# Patient Record
Sex: Male | Born: 1975 | Race: White | Hispanic: No | Marital: Married | Smoking: Never smoker
Health system: Southern US, Community
[De-identification: ages and names within clinical notes are randomized; demographics above are authoritative.]

## PROBLEM LIST (undated history)

## (undated) DIAGNOSIS — H547 Unspecified visual loss: Secondary | ICD-10-CM

## (undated) DIAGNOSIS — R200 Anesthesia of skin: Secondary | ICD-10-CM

## (undated) DIAGNOSIS — A419 Sepsis, unspecified organism: Secondary | ICD-10-CM

## (undated) DIAGNOSIS — F329 Major depressive disorder, single episode, unspecified: Secondary | ICD-10-CM

## (undated) DIAGNOSIS — E119 Type 2 diabetes mellitus without complications: Secondary | ICD-10-CM

## (undated) DIAGNOSIS — G629 Polyneuropathy, unspecified: Secondary | ICD-10-CM

## (undated) DIAGNOSIS — F419 Anxiety disorder, unspecified: Secondary | ICD-10-CM

## (undated) DIAGNOSIS — R339 Retention of urine, unspecified: Secondary | ICD-10-CM

## (undated) DIAGNOSIS — B029 Zoster without complications: Secondary | ICD-10-CM

## (undated) DIAGNOSIS — F32A Depression, unspecified: Secondary | ICD-10-CM

## (undated) HISTORY — PX: COMBINED KIDNEY-PANCREAS TRANSPLANT: SHX1382

## (undated) HISTORY — DX: Anesthesia of skin: R20.0

## (undated) HISTORY — PX: WISDOM TOOTH EXTRACTION: SHX21

## (undated) HISTORY — DX: Type 2 diabetes mellitus without complications: E11.9

## (undated) HISTORY — PX: EYE SURGERY: SHX253

## (undated) HISTORY — PX: KIDNEY SURGERY: SHX687

## (undated) HISTORY — DX: Zoster without complications: B02.9

## (undated) HISTORY — DX: Polyneuropathy, unspecified: G62.9

## (undated) HISTORY — DX: Major depressive disorder, single episode, unspecified: F32.9

## (undated) HISTORY — DX: Sepsis, unspecified organism: A41.9

## (undated) HISTORY — DX: Depression, unspecified: F32.A

## (undated) HISTORY — DX: Unspecified visual loss: H54.7

## (undated) HISTORY — DX: Anxiety disorder, unspecified: F41.9

## (undated) HISTORY — DX: Retention of urine, unspecified: R33.9

---

## 2009-03-17 ENCOUNTER — Ambulatory Visit: Payer: Self-pay | Admitting: Radiology

## 2009-03-17 ENCOUNTER — Emergency Department (HOSPITAL_BASED_OUTPATIENT_CLINIC_OR_DEPARTMENT_OTHER): Admission: EM | Admit: 2009-03-17 | Discharge: 2009-03-17 | Payer: Self-pay | Admitting: Emergency Medicine

## 2009-03-18 ENCOUNTER — Inpatient Hospital Stay (HOSPITAL_COMMUNITY): Admission: EM | Admit: 2009-03-18 | Discharge: 2009-03-23 | Payer: Self-pay | Admitting: Internal Medicine

## 2009-03-18 ENCOUNTER — Encounter (HOSPITAL_COMMUNITY): Payer: Self-pay | Admitting: Emergency Medicine

## 2009-03-18 ENCOUNTER — Ambulatory Visit: Payer: Self-pay | Admitting: Diagnostic Radiology

## 2009-03-21 ENCOUNTER — Encounter (INDEPENDENT_AMBULATORY_CARE_PROVIDER_SITE_OTHER): Payer: Self-pay | Admitting: Internal Medicine

## 2009-03-21 ENCOUNTER — Ambulatory Visit: Payer: Self-pay | Admitting: Infectious Diseases

## 2009-03-23 ENCOUNTER — Encounter: Payer: Self-pay | Admitting: Infectious Diseases

## 2009-04-14 ENCOUNTER — Ambulatory Visit: Payer: Self-pay | Admitting: Infectious Diseases

## 2009-04-14 DIAGNOSIS — J1100 Influenza due to unidentified influenza virus with unspecified type of pneumonia: Secondary | ICD-10-CM

## 2009-04-14 DIAGNOSIS — J189 Pneumonia, unspecified organism: Secondary | ICD-10-CM | POA: Insufficient documentation

## 2009-08-07 ENCOUNTER — Ambulatory Visit (HOSPITAL_COMMUNITY): Admission: RE | Admit: 2009-08-07 | Discharge: 2009-08-07 | Payer: Self-pay | Admitting: Gastroenterology

## 2009-10-09 ENCOUNTER — Emergency Department (HOSPITAL_BASED_OUTPATIENT_CLINIC_OR_DEPARTMENT_OTHER): Admission: EM | Admit: 2009-10-09 | Discharge: 2009-10-10 | Payer: Self-pay | Admitting: Emergency Medicine

## 2009-10-09 ENCOUNTER — Ambulatory Visit: Payer: Self-pay | Admitting: Diagnostic Radiology

## 2010-02-23 NOTE — Miscellaneous (Signed)
Summary: HIPAA Restrictions  HIPAA Restrictions   Imported By: Florinda Marker 04/15/2009 16:23:46  _____________________________________________________________________  External Attachment:    Type:   Image     Comment:   External Document

## 2010-02-23 NOTE — Assessment & Plan Note (Signed)
Summary: hsfu need chart/kam   CC:  new patient hospital follow up pneumonia and Depression.  History of Present Illness: 35 yo with DM and recent admit for PNA.  I saw him in the hospital when he was admitted with high fevers, cough, sob and n/v.    He was admitted to [facility] on March 19, 2009, and started on antibiotics for community-acquired pneumonia as well as for influenza with  Zithromax, Rocephin, and Tamiflu.   He worsened initially but I think this was due to IVF he had gotten (BNP was 600).  He had all cxs neg inc leg ag, hiv, flu test, fungal ab, crytop, ana  and rf.  fortunately he improved and was d/ced.  Returns for f/u Has followed up with Dr Louis Meckel and has been changed from norvacs to diovan given the worsening edema. No fevers chills, NS (unless BS is low) nausea vomiting.  Cough is resolved.  appetit good. no wt loss  Consult note 35 yo with a history of DM. He also has a  history of hypertension, anxiety, depression and GERD.  He reports also  history of anemia in the past.  He started feeling ill approximately 6  days ago when he had a headache and a yucky feeling.  He said he had  some mild chest tightness and cough, but thought it was due to exposure  to smoke at his in-laws' house where he was visiting the day prior.  He  also had hiccups developed.  His nerves were bad apparently because he   was concerned about an upcoming eye surgery.  The next day, he developed  worsening headaches, nausea, and vomiting.  He went to the emergency  room on March 17, 2009, with these symptoms and was told that he had  viral illness and was given Phenergan and Zofran and discharged home.   He continued to worsen at that time, and at home that his nausea and  vomiting continued.  He started to have severe coughing, some fevers, although they did not check with a thermometer.  He had some chills and  night sweats as well.  He has also had a marked decrease in his appetite  for the last few  days and says he has not eaten much in a week.  He  denies any rash, sore throat, swollen lymph glands, diarrhea, abdominal  pain, joint swelling or aches, or myalgias.         Depression History:      The patient denies a depressed mood most of the day and a diminished interest in his usual daily activities.        The patient denies that he feels like life is not worth living, denies that he wishes that he were dead, and denies that he has thought about ending his life.        Preventive Screening-Counseling & Management  Alcohol-Tobacco     Alcohol drinks/day: 0     Smoking Status: never  Caffeine-Diet-Exercise     Caffeine use/day: soda and tea x 4 a day     Does Patient Exercise: yes     Type of exercise: walking     Exercise (avg: min/session): <30     Times/week: >7   Updated Prior Medication List: LANTUS SOLOSTAR 100 UNIT/ML SOLN (INSULIN GLARGINE) 28 units at night APIDRA 100 UNIT/ML SOLN (INSULIN GLULISINE) approximately 12 units daily PROTONIX 40 MG TBEC (PANTOPRAZOLE SODIUM) Take 1 tablet by mouth once a day [facility]  XR 75 MG XR24H-CAP (VENLAFAXINE HCL) Take 1 tablet by mouth once a day ZANTAC 150 MG TABS (RANITIDINE HCL) one tablet nightly DIOVAN HCT 320-25 MG TABS (VALSARTAN-HYDROCHLOROTHIAZIDE) Take 1 tablet by mouth once a day COLESTIPOL HCL 5 GM PACK (COLESTIPOL HCL) 5 grams daily METAMUCIL 30.9 % POWD (PSYLLIUM) once a day METANX 3-35-2 MG TABS (L-METHYLFOLATE-B6-B12) take 2 daily DIPHENOXYLATE-ATROPINE 2.5-0.025 MG TABS (DIPHENOXYLATE-ATROPINE) as needed  Current Allergies: ! SULFA Past History:  Family History: Last updated: 04/14/2009 [place]  Social History: Last updated: 04/14/2009  The patient is married.  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  He does not smoke, drink, or   use drugs.  He has no children at home.  He has two cats at home, but no   birds or other animal exposures.  He lives in an apartment.  He does not    do many outdoor cavities and has no unusual hobbies.  He has never been   outside of the country and has no recent travel.  He does have some sick   contacts at work, but none in his family.   Risk Factors: Alcohol Use: 0 (04/14/2009) Caffeine Use: soda and tea x 4 a day (04/14/2009) Exercise: yes (04/14/2009)  Risk Factors: Smoking Status: never (04/14/2009)  Past Medical History: 1. 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   2. 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   3. He has anxiety and depression.   4. GERD.   5. Allergies but no asthma.   6. Hypertension, recently diagnosed.   7. Mild chronic renal insufficiency.  Per the patient his creatinine       is at the upper limit of normal, but he does not know what it is.       He has had no further workup for this.   8. Mild anemia.  He says he had his iron level checked and was told to       eat more meat in the past.   9. Prior history of pneumonia many years ago in college.      He has had no surgeries.   Family History: [place]  Social History:  The patient is married.  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  He does not smoke, drink, or   use drugs.  He has no children at home.  He has two cats at home, but no   birds or other animal exposures.  He lives in an apartment.  He does not   do many outdoor cavities and has no unusual hobbies.  He has never been   outside of the country and has no recent travel.  He does have some sick   contacts at work, but none in his family.   Review of Systems       11 systems reviewed and negative except per HPI   Vital Signs:  Patient profile:   35 year old male Height:      70.5 inches (179.07 cm) Weight:      166.4 pounds (75.64 kg) BMI:     23.62 Temp:     97.7 degrees F (36.50 degrees C) oral Pulse rate:   96 / minute BP sitting:   152 / 101  (left arm)  Vitals Entered By: Wendall Mola CMA Duncan Dull) (April 14, 2009 11:53 AM) CC: new patient hospital follow up pneumonia, Depression Is Patient Diabetic? Yes  Did you bring your meter with you today? No Pain Assessment Patient in pain? no      Nutritional Status BMI of 19 -24 = normal Nutritional Status Detail appetite "normal"  Does patient need assistance? Functional Status Self care Ambulation Normal Comments no missed doses of meds per patient   Physical Exam  General:  alert and well-developed.  alert and well-developed.   Head:  normocephalic.  normocephalic.   Eyes:  decrease acuity Mouth:  fair dentition.  fair dentition.   Neck:  supple.  supple.   Lungs:  normal respiratory effort and normal breath sounds.  normal respiratory effort and normal breath sounds.   Heart:  normal rate and regular rhythm.  normal rate and regular rhythm.   Abdomen:  soft and non-tender.  soft and non-tender.   Msk:  normal ROM and no joint tenderness.  normal ROM and no joint tenderness.   Extremities:  1+ edema bil Neurologic:  alert & oriented X3 and cranial nerves II-XII intact.  alert & oriented X3 and cranial nerves II-XII intact.   Skin:  no rashes.  no rashes.   Cervical Nodes:  no anterior cervical adenopathy and no posterior cervical adenopathy.  no anterior cervical adenopathy and no posterior cervical adenopathy.   Psych:  Oriented X3 and memory intact for recent and remote.  Oriented X3 and memory intact for recent and remote.   Additional Exam:  Echo Mar 23 2009   Left ventricle: The cavity size was normal. There was mild     concentric hypertrophy. Systolic function was normal. The     estimated ejection fraction was in the range of 50% to 55%. Wall     motion was normal; there were no regional wall motion     abnormalities. Doppler parameters are consistent with abnormal     left ventricular relaxation (grade 1 diastolic dysfunction).   - Mitral valve: Mild regurgitation.   - Left atrium: The atrium was mildly dilated.   -  Pericardium, extracardiac: A small pericardial effusion was     identified posterior to the heart. There was no evidence of     hemodynamic compromise.   CT chest feb 25th 1. Moderate layering pleural effusions bilaterally.  Favor   transudative.   2.  Diffuse but basilar predominant  pulmonary nodular opacity.   Centrilobular and juxtapleural pattern with mild septal thickening.   Acute viral / atypical pneumonia favored.   3.  Mild left perinephric stranding, this is incompletely   visualized and is not apparent on the recent renal ultrasound.   Pyelonephritis cannot be excluded.  Correlation with urinalysis may   be helpful.   Impression & Recommendations:  Problem # 1:  INFLUENZA WITH PNEUMONIA (ICD-487.0) Influenza B + PNA He is much improved s/p treatment with tamiflu and abx.   Will follow up as needed.  Orders: Est. Patient Level III (29562)  Medications Added to Medication List This Visit: 1)  Lantus Solostar 100 Unit/ml Soln (Insulin glargine) .... 28 units at night 2)  Apidra 100 Unit/ml Soln (Insulin glulisine) .... Approximately 12 units daily 3)  Protonix 40 Mg Tbec (Pantoprazole sodium) .... Take 1 tablet by mouth once a day 4)  Effexor Xr 75 Mg Xr24h-cap (Venlafaxine hcl) .... Take 1 tablet by mouth once a day 5)  Zantac 150 Mg Tabs (Ranitidine hcl) .... One tablet nightly 6)  Diovan Hct 320-25 Mg Tabs (Valsartan-hydrochlorothiazide) .... Take 1 tablet by mouth once a day 7)  Colestipol Hcl  5 Gm Pack (Colestipol hcl) .... 5 grams daily 8)  Metamucil 30.9 % Powd (Psyllium) .... Once a day 9)  Metanx 3-35-2 Mg Tabs (L-methylfolate-b6-b12) .... Take 2 daily 10)  Diphenoxylate-atropine 2.5-0.025 Mg Tabs (Diphenoxylate-atropine) .... As needed  Patient Instructions: 1)  Follow up as needed. 2)   Call if you have new cough, shortness of breath or fevers.

## 2010-02-25 ENCOUNTER — Emergency Department (HOSPITAL_COMMUNITY)
Admission: EM | Admit: 2010-02-25 | Discharge: 2010-02-25 | Disposition: A | Discharge status: Home / Self Care | Payer: Medicare Other | Attending: Emergency Medicine | Admitting: Emergency Medicine

## 2010-02-25 ENCOUNTER — Emergency Department (HOSPITAL_COMMUNITY): Payer: Medicare Other

## 2010-02-25 DIAGNOSIS — F29 Unspecified psychosis not due to a substance or known physiological condition: Secondary | ICD-10-CM | POA: Insufficient documentation

## 2010-02-25 DIAGNOSIS — R51 Headache: Secondary | ICD-10-CM | POA: Insufficient documentation

## 2010-02-25 DIAGNOSIS — Z79899 Other long term (current) drug therapy: Secondary | ICD-10-CM | POA: Insufficient documentation

## 2010-02-25 DIAGNOSIS — Z992 Dependence on renal dialysis: Secondary | ICD-10-CM | POA: Insufficient documentation

## 2010-02-25 DIAGNOSIS — K219 Gastro-esophageal reflux disease without esophagitis: Secondary | ICD-10-CM | POA: Insufficient documentation

## 2010-02-25 DIAGNOSIS — F341 Dysthymic disorder: Secondary | ICD-10-CM | POA: Insufficient documentation

## 2010-02-25 DIAGNOSIS — R4182 Altered mental status, unspecified: Secondary | ICD-10-CM | POA: Insufficient documentation

## 2010-02-25 DIAGNOSIS — I10 Essential (primary) hypertension: Secondary | ICD-10-CM | POA: Insufficient documentation

## 2010-02-25 DIAGNOSIS — E119 Type 2 diabetes mellitus without complications: Secondary | ICD-10-CM | POA: Insufficient documentation

## 2010-02-25 DIAGNOSIS — R4789 Other speech disturbances: Secondary | ICD-10-CM | POA: Insufficient documentation

## 2010-02-25 LAB — CBC
HCT: 33 % — ABNORMAL LOW (ref 39.0–52.0)
MCHC: 31.2 g/dL (ref 30.0–36.0)
RDW: 17.6 % — ABNORMAL HIGH (ref 11.5–15.5)
WBC: 6 10*3/uL (ref 4.0–10.5)

## 2010-02-25 LAB — DIFFERENTIAL
Basophils Absolute: 0.1 10*3/uL (ref 0.0–0.1)
Basophils Relative: 2 % — ABNORMAL HIGH (ref 0–1)
Lymphs Abs: 1.4 10*3/uL (ref 0.7–4.0)
Monocytes Absolute: 0.6 10*3/uL (ref 0.1–1.0)
Neutrophils Relative %: 60 % (ref 43–77)

## 2010-02-25 LAB — BASIC METABOLIC PANEL
CO2: 26 mEq/L (ref 19–32)
Calcium: 8.2 mg/dL — ABNORMAL LOW (ref 8.4–10.5)
Creatinine, Ser: 3.53 mg/dL — ABNORMAL HIGH (ref 0.4–1.5)
GFR calc non Af Amer: 20 mL/min — ABNORMAL LOW (ref >60)
Sodium: 139 mEq/L (ref 135–145)

## 2010-02-25 LAB — PROTIME-INR
INR: 1.26 (ref 0.00–1.49)
Prothrombin Time: 16 seconds — ABNORMAL HIGH (ref 11.6–15.2)

## 2010-04-08 LAB — DIFFERENTIAL
Basophils Absolute: 0 10*3/uL (ref 0.0–0.1)
Basophils Relative: 0 % (ref 0–1)
Monocytes Relative: 4 % (ref 3–12)
Neutro Abs: 9.1 10*3/uL — ABNORMAL HIGH (ref 1.7–7.7)
Neutrophils Relative %: 88 % — ABNORMAL HIGH (ref 43–77)

## 2010-04-08 LAB — HEPATIC FUNCTION PANEL
ALT: 29 U/L (ref 0–53)
AST: 44 U/L — ABNORMAL HIGH (ref 0–37)
Alkaline Phosphatase: 248 U/L — ABNORMAL HIGH (ref 39–117)
Total Bilirubin: 0.7 mg/dL (ref 0.3–1.2)

## 2010-04-08 LAB — CBC
Hemoglobin: 10.3 g/dL — ABNORMAL LOW (ref 13.0–17.0)
MCH: 30.3 pg (ref 26.0–34.0)
MCV: 89 fL (ref 78.0–100.0)
Platelets: 219 10*3/uL (ref 150–400)
RBC: 3.39 MIL/uL — ABNORMAL LOW (ref 4.22–5.81)
RDW: 14.9 % (ref 11.5–15.5)
WBC: 10.3 10*3/uL (ref 4.0–10.5)

## 2010-04-08 LAB — LIPASE, BLOOD: Lipase: 17 U/L — ABNORMAL LOW (ref 23–300)

## 2010-04-08 LAB — BASIC METABOLIC PANEL
CO2: 26 mEq/L (ref 19–32)
Chloride: 106 mEq/L (ref 96–112)
Creatinine, Ser: 4.2 mg/dL — ABNORMAL HIGH (ref 0.4–1.5)
GFR calc Af Amer: 20 mL/min — ABNORMAL LOW (ref >60)
Potassium: 4.1 mEq/L (ref 3.5–5.1)
Sodium: 145 mEq/L (ref 135–145)

## 2010-04-10 LAB — GLUCOSE, CAPILLARY
Glucose-Capillary: 142 mg/dL — ABNORMAL HIGH (ref 70–99)
Glucose-Capillary: 41 mg/dL — CL (ref 70–99)

## 2010-04-16 LAB — CULTURE, BLOOD (ROUTINE X 2)
Culture: NO GROWTH
Culture: NO GROWTH

## 2010-04-16 LAB — CBC
HCT: 26.8 % — ABNORMAL LOW (ref 39.0–52.0)
HCT: 26.9 % — ABNORMAL LOW (ref 39.0–52.0)
HCT: 27.3 % — ABNORMAL LOW (ref 39.0–52.0)
HCT: 27.7 % — ABNORMAL LOW (ref 39.0–52.0)
Hemoglobin: 9.1 g/dL — ABNORMAL LOW (ref 13.0–17.0)
Hemoglobin: 9.2 g/dL — ABNORMAL LOW (ref 13.0–17.0)
Hemoglobin: 9.4 g/dL — ABNORMAL LOW (ref 13.0–17.0)
MCHC: 32.9 g/dL (ref 30.0–36.0)
MCHC: 33.8 g/dL (ref 30.0–36.0)
MCHC: 34 g/dL (ref 30.0–36.0)
MCHC: 34.1 g/dL (ref 30.0–36.0)
MCHC: 34.4 g/dL (ref 30.0–36.0)
MCV: 84.4 fL (ref 78.0–100.0)
MCV: 84.5 fL (ref 78.0–100.0)
MCV: 85.1 fL (ref 78.0–100.0)
MCV: 85.4 fL (ref 78.0–100.0)
MCV: 85.8 fL (ref 78.0–100.0)
MCV: 85.9 fL (ref 78.0–100.0)
MCV: 86.5 fL (ref 78.0–100.0)
Platelets: 100 10*3/uL — ABNORMAL LOW (ref 150–400)
Platelets: 109 10*3/uL — ABNORMAL LOW (ref 150–400)
Platelets: 111 10*3/uL — ABNORMAL LOW (ref 150–400)
Platelets: 112 10*3/uL — ABNORMAL LOW (ref 150–400)
Platelets: 142 10*3/uL — ABNORMAL LOW (ref 150–400)
Platelets: 155 10*3/uL (ref 150–400)
RBC: 2.8 MIL/uL — ABNORMAL LOW (ref 4.22–5.81)
RBC: 2.85 MIL/uL — ABNORMAL LOW (ref 4.22–5.81)
RBC: 3.19 MIL/uL — ABNORMAL LOW (ref 4.22–5.81)
RBC: 3.2 MIL/uL — ABNORMAL LOW (ref 4.22–5.81)
RBC: 3.27 MIL/uL — ABNORMAL LOW (ref 4.22–5.81)
RDW: 13.5 % (ref 11.5–15.5)
RDW: 14.4 % (ref 11.5–15.5)
RDW: 14.5 % (ref 11.5–15.5)
RDW: 14.6 % (ref 11.5–15.5)
RDW: 14.6 % (ref 11.5–15.5)
RDW: 14.7 % (ref 11.5–15.5)
RDW: 15.3 % (ref 11.5–15.5)
WBC: 4.7 10*3/uL (ref 4.0–10.5)
WBC: 5.9 10*3/uL (ref 4.0–10.5)
WBC: 6.9 10*3/uL (ref 4.0–10.5)
WBC: 8.3 10*3/uL (ref 4.0–10.5)

## 2010-04-16 LAB — DIFFERENTIAL
Band Neutrophils: 4 % (ref 0–10)
Basophils Absolute: 0 10*3/uL (ref 0.0–0.1)
Basophils Absolute: 0 10*3/uL (ref 0.0–0.1)
Basophils Absolute: 0 10*3/uL (ref 0.0–0.1)
Basophils Relative: 0 % (ref 0–1)
Basophils Relative: 0 % (ref 0–1)
Eosinophils Absolute: 0 10*3/uL (ref 0.0–0.7)
Eosinophils Absolute: 0 10*3/uL (ref 0.0–0.7)
Eosinophils Absolute: 0 10*3/uL (ref 0.0–0.7)
Eosinophils Absolute: 0 10*3/uL (ref 0.0–0.7)
Eosinophils Relative: 0 % (ref 0–5)
Eosinophils Relative: 0 % (ref 0–5)
Eosinophils Relative: 0 % (ref 0–5)
Eosinophils Relative: 0 % (ref 0–5)
Eosinophils Relative: 1 % (ref 0–5)
Lymphocytes Relative: 10 % — ABNORMAL LOW (ref 12–46)
Lymphocytes Relative: 12 % (ref 12–46)
Lymphocytes Relative: 13 % (ref 12–46)
Lymphocytes Relative: 15 % (ref 12–46)
Lymphs Abs: 0.4 10*3/uL — ABNORMAL LOW (ref 0.7–4.0)
Lymphs Abs: 0.8 10*3/uL (ref 0.7–4.0)
Lymphs Abs: 0.9 10*3/uL (ref 0.7–4.0)
Lymphs Abs: 0.9 10*3/uL (ref 0.7–4.0)
Lymphs Abs: 1 10*3/uL (ref 0.7–4.0)
Lymphs Abs: 1 10*3/uL (ref 0.7–4.0)
Monocytes Absolute: 0.5 10*3/uL (ref 0.1–1.0)
Monocytes Absolute: 0.5 10*3/uL (ref 0.1–1.0)
Monocytes Absolute: 0.6 10*3/uL (ref 0.1–1.0)
Monocytes Absolute: 0.6 10*3/uL (ref 0.1–1.0)
Monocytes Absolute: 0.6 10*3/uL (ref 0.1–1.0)
Monocytes Relative: 10 % (ref 3–12)
Monocytes Relative: 7 % (ref 3–12)
Monocytes Relative: 8 % (ref 3–12)
Monocytes Relative: 9 % (ref 3–12)
Neutro Abs: 3.9 10*3/uL (ref 1.7–7.7)
Neutro Abs: 4.5 10*3/uL (ref 1.7–7.7)
Neutro Abs: 8.7 10*3/uL — ABNORMAL HIGH (ref 1.7–7.7)
Neutrophils Relative %: 76 % (ref 43–77)
Neutrophils Relative %: 80 % — ABNORMAL HIGH (ref 43–77)

## 2010-04-16 LAB — STOOL CULTURE

## 2010-04-16 LAB — RHEUMATOID FACTOR: Rhuematoid fact SerPl-aCnc: <20 IU/mL (ref 0–20)

## 2010-04-16 LAB — HIGH SENSITIVITY CRP: CRP, High Sensitivity: 63.2 mg/L — ABNORMAL HIGH

## 2010-04-16 LAB — VITAMIN B12: Vitamin B-12: >2000 pg/mL — ABNORMAL HIGH (ref 211–911)

## 2010-04-16 LAB — CLOSTRIDIUM DIFFICILE EIA: C difficile Toxins A+B, EIA: NEGATIVE

## 2010-04-16 LAB — COMPREHENSIVE METABOLIC PANEL
ALT: 40 U/L (ref 0–53)
ALT: 41 U/L (ref 0–53)
AST: 73 U/L — ABNORMAL HIGH (ref 0–37)
AST: 90 U/L — ABNORMAL HIGH (ref 0–37)
AST: 98 U/L — ABNORMAL HIGH (ref 0–37)
Albumin: 2 g/dL — ABNORMAL LOW (ref 3.5–5.2)
Albumin: 2.1 g/dL — ABNORMAL LOW (ref 3.5–5.2)
Alkaline Phosphatase: 105 U/L (ref 39–117)
BUN: 22 mg/dL (ref 6–23)
CO2: 21 mEq/L (ref 19–32)
CO2: 22 mEq/L (ref 19–32)
Calcium: 7.4 mg/dL — ABNORMAL LOW (ref 8.4–10.5)
Calcium: 7.6 mg/dL — ABNORMAL LOW (ref 8.4–10.5)
Calcium: 7.6 mg/dL — ABNORMAL LOW (ref 8.4–10.5)
Chloride: 111 mEq/L (ref 96–112)
Creatinine, Ser: 1.98 mg/dL — ABNORMAL HIGH (ref 0.4–1.5)
Creatinine, Ser: 2.38 mg/dL — ABNORMAL HIGH (ref 0.4–1.5)
Creatinine, Ser: 2.89 mg/dL — ABNORMAL HIGH (ref 0.4–1.5)
GFR calc Af Amer: 31 mL/min — ABNORMAL LOW (ref >60)
GFR calc Af Amer: 39 mL/min — ABNORMAL LOW (ref >60)
GFR calc Af Amer: 48 mL/min — ABNORMAL LOW (ref >60)
GFR calc non Af Amer: 26 mL/min — ABNORMAL LOW (ref >60)
GFR calc non Af Amer: 32 mL/min — ABNORMAL LOW (ref >60)
GFR calc non Af Amer: 48 mL/min — ABNORMAL LOW (ref >60)
Glucose, Bld: 104 mg/dL — ABNORMAL HIGH (ref 70–99)
Glucose, Bld: 152 mg/dL — ABNORMAL HIGH (ref 70–99)
Potassium: 3.1 mEq/L — ABNORMAL LOW (ref 3.5–5.1)
Sodium: 135 mEq/L (ref 135–145)
Sodium: 139 mEq/L (ref 135–145)
Total Bilirubin: 0.7 mg/dL (ref 0.3–1.2)
Total Bilirubin: 0.8 mg/dL (ref 0.3–1.2)
Total Protein: 5.1 g/dL — ABNORMAL LOW (ref 6.0–8.3)
Total Protein: 5.1 g/dL — ABNORMAL LOW (ref 6.0–8.3)
Total Protein: 5.1 g/dL — ABNORMAL LOW (ref 6.0–8.3)
Total Protein: 5.4 g/dL — ABNORMAL LOW (ref 6.0–8.3)

## 2010-04-16 LAB — GLUCOSE, CAPILLARY
Glucose-Capillary: 108 mg/dL — ABNORMAL HIGH (ref 70–99)
Glucose-Capillary: 114 mg/dL — ABNORMAL HIGH (ref 70–99)
Glucose-Capillary: 137 mg/dL — ABNORMAL HIGH (ref 70–99)
Glucose-Capillary: 146 mg/dL — ABNORMAL HIGH (ref 70–99)
Glucose-Capillary: 162 mg/dL — ABNORMAL HIGH (ref 70–99)
Glucose-Capillary: 205 mg/dL — ABNORMAL HIGH (ref 70–99)
Glucose-Capillary: 228 mg/dL — ABNORMAL HIGH (ref 70–99)
Glucose-Capillary: 37 mg/dL — CL (ref 70–99)
Glucose-Capillary: 54 mg/dL — ABNORMAL LOW (ref 70–99)
Glucose-Capillary: 56 mg/dL — ABNORMAL LOW (ref 70–99)
Glucose-Capillary: 69 mg/dL — ABNORMAL LOW (ref 70–99)
Glucose-Capillary: 75 mg/dL (ref 70–99)
Glucose-Capillary: 78 mg/dL (ref 70–99)
Glucose-Capillary: 81 mg/dL (ref 70–99)
Glucose-Capillary: 85 mg/dL (ref 70–99)
Glucose-Capillary: 89 mg/dL (ref 70–99)
Glucose-Capillary: 95 mg/dL (ref 70–99)

## 2010-04-16 LAB — LEGIONELLA ANTIGEN, URINE: Legionella Antigen, Urine: NEGATIVE

## 2010-04-16 LAB — BASIC METABOLIC PANEL
BUN: 15 mg/dL (ref 6–23)
BUN: 29 mg/dL — ABNORMAL HIGH (ref 6–23)
BUN: 42 mg/dL — ABNORMAL HIGH (ref 6–23)
CO2: 23 mEq/L (ref 19–32)
CO2: 26 mEq/L (ref 19–32)
CO2: 26 mEq/L (ref 19–32)
Calcium: 8 mg/dL — ABNORMAL LOW (ref 8.4–10.5)
Chloride: 102 mEq/L (ref 96–112)
Chloride: 110 mEq/L (ref 96–112)
Creatinine, Ser: 1.68 mg/dL — ABNORMAL HIGH (ref 0.4–1.5)
Creatinine, Ser: 1.8 mg/dL — ABNORMAL HIGH (ref 0.4–1.5)
GFR calc Af Amer: 54 mL/min — ABNORMAL LOW (ref >60)
GFR calc Af Amer: 58 mL/min — ABNORMAL LOW (ref >60)
GFR calc non Af Amer: 44 mL/min — ABNORMAL LOW (ref >60)
GFR calc non Af Amer: 48 mL/min — ABNORMAL LOW (ref >60)
Glucose, Bld: 106 mg/dL — ABNORMAL HIGH (ref 70–99)
Glucose, Bld: 133 mg/dL — ABNORMAL HIGH (ref 70–99)
Glucose, Bld: 192 mg/dL — ABNORMAL HIGH (ref 70–99)
Potassium: 3.4 mEq/L — ABNORMAL LOW (ref 3.5–5.1)
Potassium: 4.3 mEq/L (ref 3.5–5.1)
Sodium: 134 mEq/L — ABNORMAL LOW (ref 135–145)
Sodium: 140 mEq/L (ref 135–145)
Sodium: 142 mEq/L (ref 135–145)

## 2010-04-16 LAB — EXPECTORATED SPUTUM ASSESSMENT W GRAM STAIN, RFLX TO RESP C

## 2010-04-16 LAB — FECAL LACTOFERRIN, QUANT

## 2010-04-16 LAB — INFLUENZA A H1N1
Influenza A RNA: NOT DETECTED
Swine Influenza H1 Gene: NOT DETECTED

## 2010-04-16 LAB — MAGNESIUM
Magnesium: 1.8 mg/dL (ref 1.5–2.5)
Magnesium: 1.9 mg/dL (ref 1.5–2.5)

## 2010-04-16 LAB — MISCELLANEOUS TEST

## 2010-04-16 LAB — FERRITIN: Ferritin: 297 ng/mL (ref 22–322)

## 2010-04-16 LAB — CROSSMATCH: ABO/RH(D): O POS

## 2010-04-16 LAB — ABO/RH: ABO/RH(D): O POS

## 2010-04-16 LAB — POCT I-STAT 3, ART BLOOD GAS (G3+)
Acid-base deficit: 3 mmol/L — ABNORMAL HIGH (ref 0.0–2.0)
O2 Saturation: 98 %
pCO2 arterial: 34.1 mmHg — ABNORMAL LOW (ref 35.0–45.0)
pO2, Arterial: 123 mmHg — ABNORMAL HIGH (ref 80.0–100.0)

## 2010-04-16 LAB — FOLATE RBC: RBC Folate: 2100 ng/mL — ABNORMAL HIGH (ref 180–600)

## 2010-04-16 LAB — CREATININE, URINE, RANDOM: Creatinine, Urine: 122.1 mg/dL

## 2010-04-16 LAB — PHOSPHORUS
Phosphorus: 3.1 mg/dL (ref 2.3–4.6)
Phosphorus: 3.6 mg/dL (ref 2.3–4.6)

## 2010-04-16 LAB — GIARDIA/CRYPTOSPORIDIUM SCREEN(EIA)
Cryptosporidium Screen (EIA): NEGATIVE
Giardia Screen - EIA: NEGATIVE

## 2010-04-16 LAB — CRYPTOCOCCAL ANTIGEN: Crypto Ag: NEGATIVE

## 2010-04-16 LAB — SODIUM, URINE, RANDOM: Sodium, Ur: 31 mEq/L

## 2010-04-16 LAB — FUNGAL ANTIBODIES PANEL, ID-BLOOD: Aspergillus fumigatus: NEGATIVE

## 2010-04-16 LAB — STREP PNEUMONIAE URINARY ANTIGEN: Strep Pneumo Urinary Antigen: NEGATIVE

## 2010-04-16 LAB — FECAL FAT, QUALITATIVE
Free fatty acids: NORMAL
Neutral Fat: NORMAL

## 2010-04-16 LAB — LIPASE, BLOOD: Lipase: 13 U/L (ref 11–59)

## 2010-04-16 LAB — ANA: Anti Nuclear Antibody(ANA): NEGATIVE

## 2010-04-16 LAB — GLIADIN ANTIBODIES, SERUM: Gliadin IgG: 0.2 U/mL (ref <7)

## 2010-04-16 LAB — HEMOCCULT GUIAC POC 1CARD (OFFICE): Fecal Occult Bld: NEGATIVE

## 2010-04-16 LAB — HIV ANTIBODY (ROUTINE TESTING W REFLEX): HIV: NONREACTIVE

## 2011-05-27 IMAGING — CR DG CHEST 2V
2 series · 2 of 2 positions shown · non-contrast
Comparison: None.

CLINICAL DATA: Fever.  Abdominal pain.  Cough.  Congestion.  Chest
pain.

CHEST - 2 VIEW

[w chest pa]
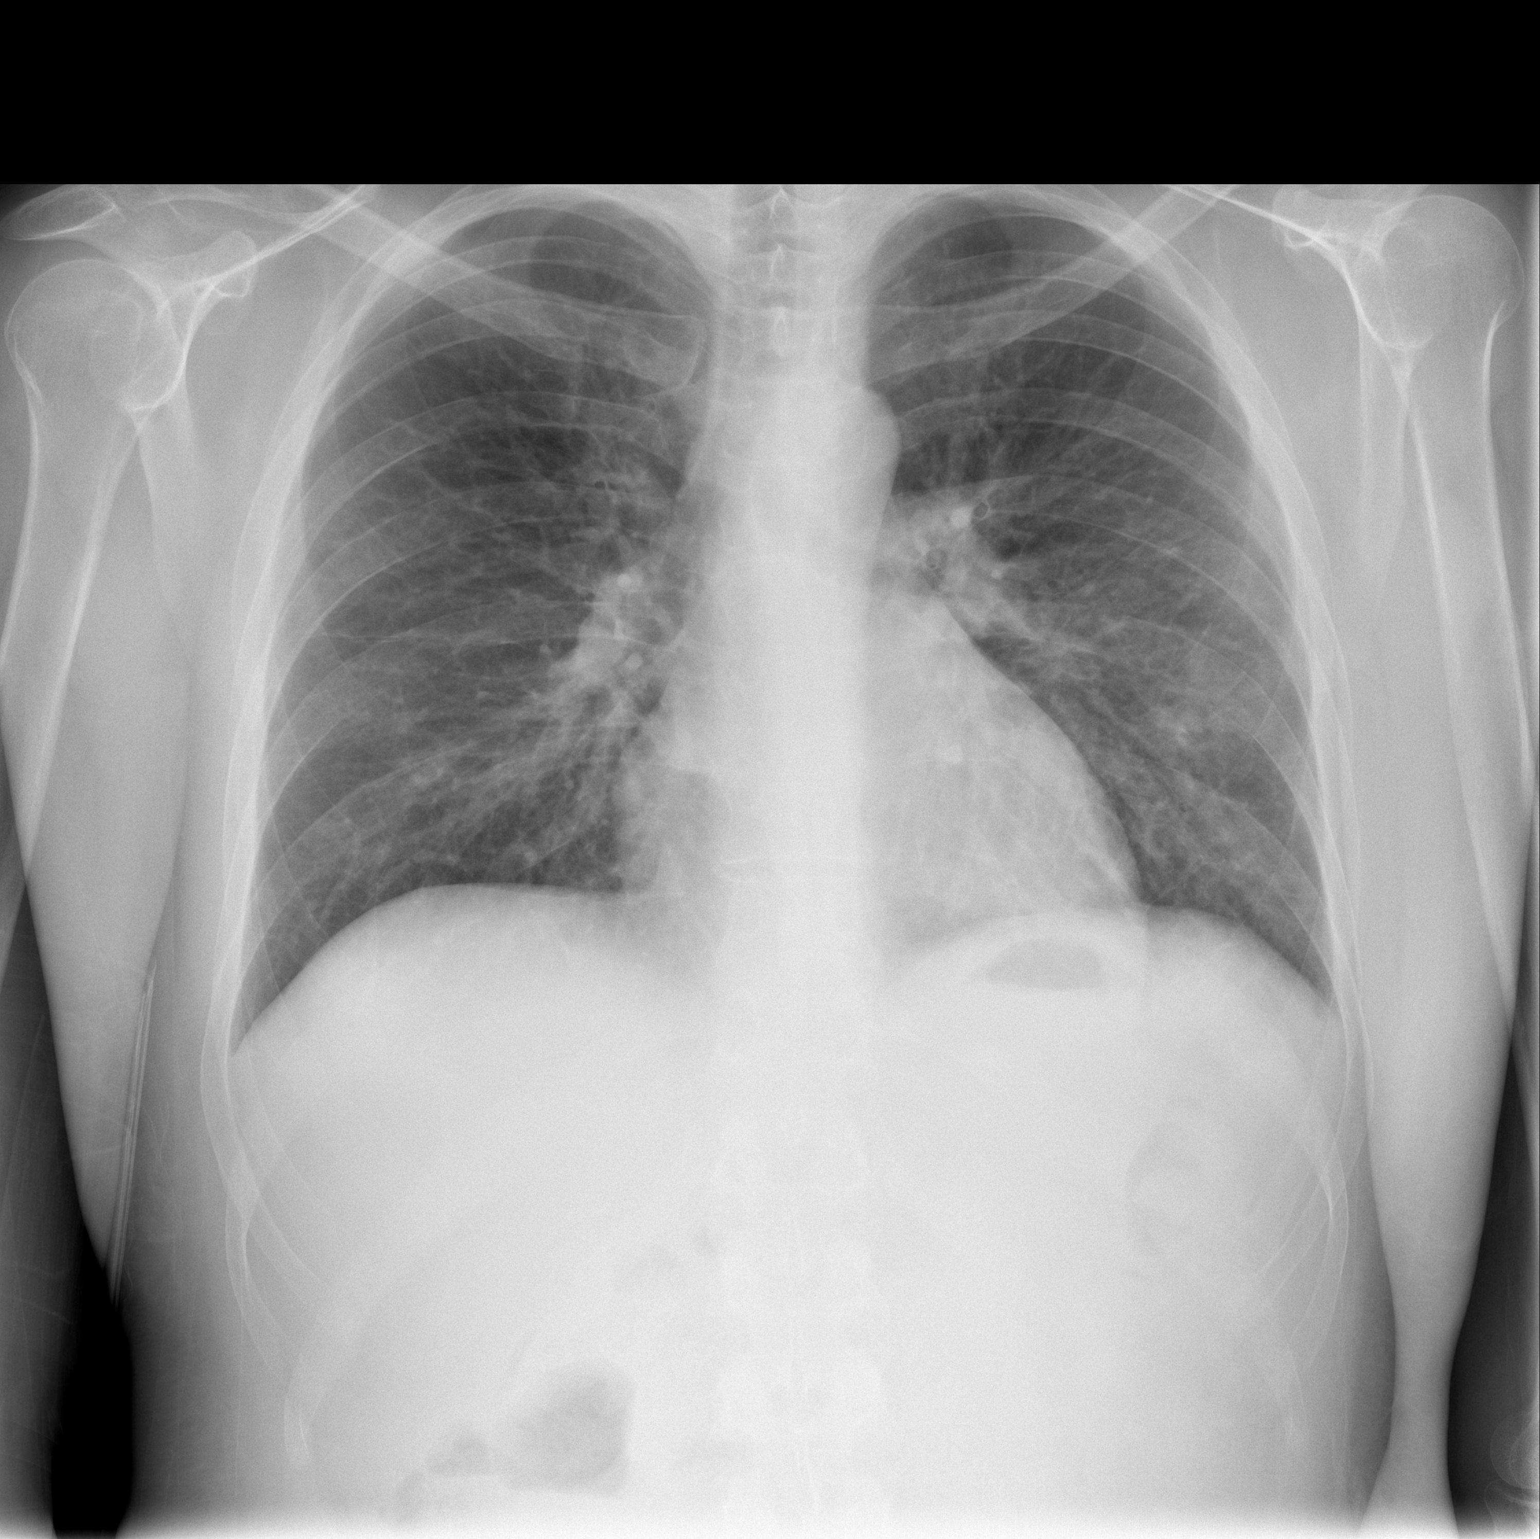

[w chest lat]
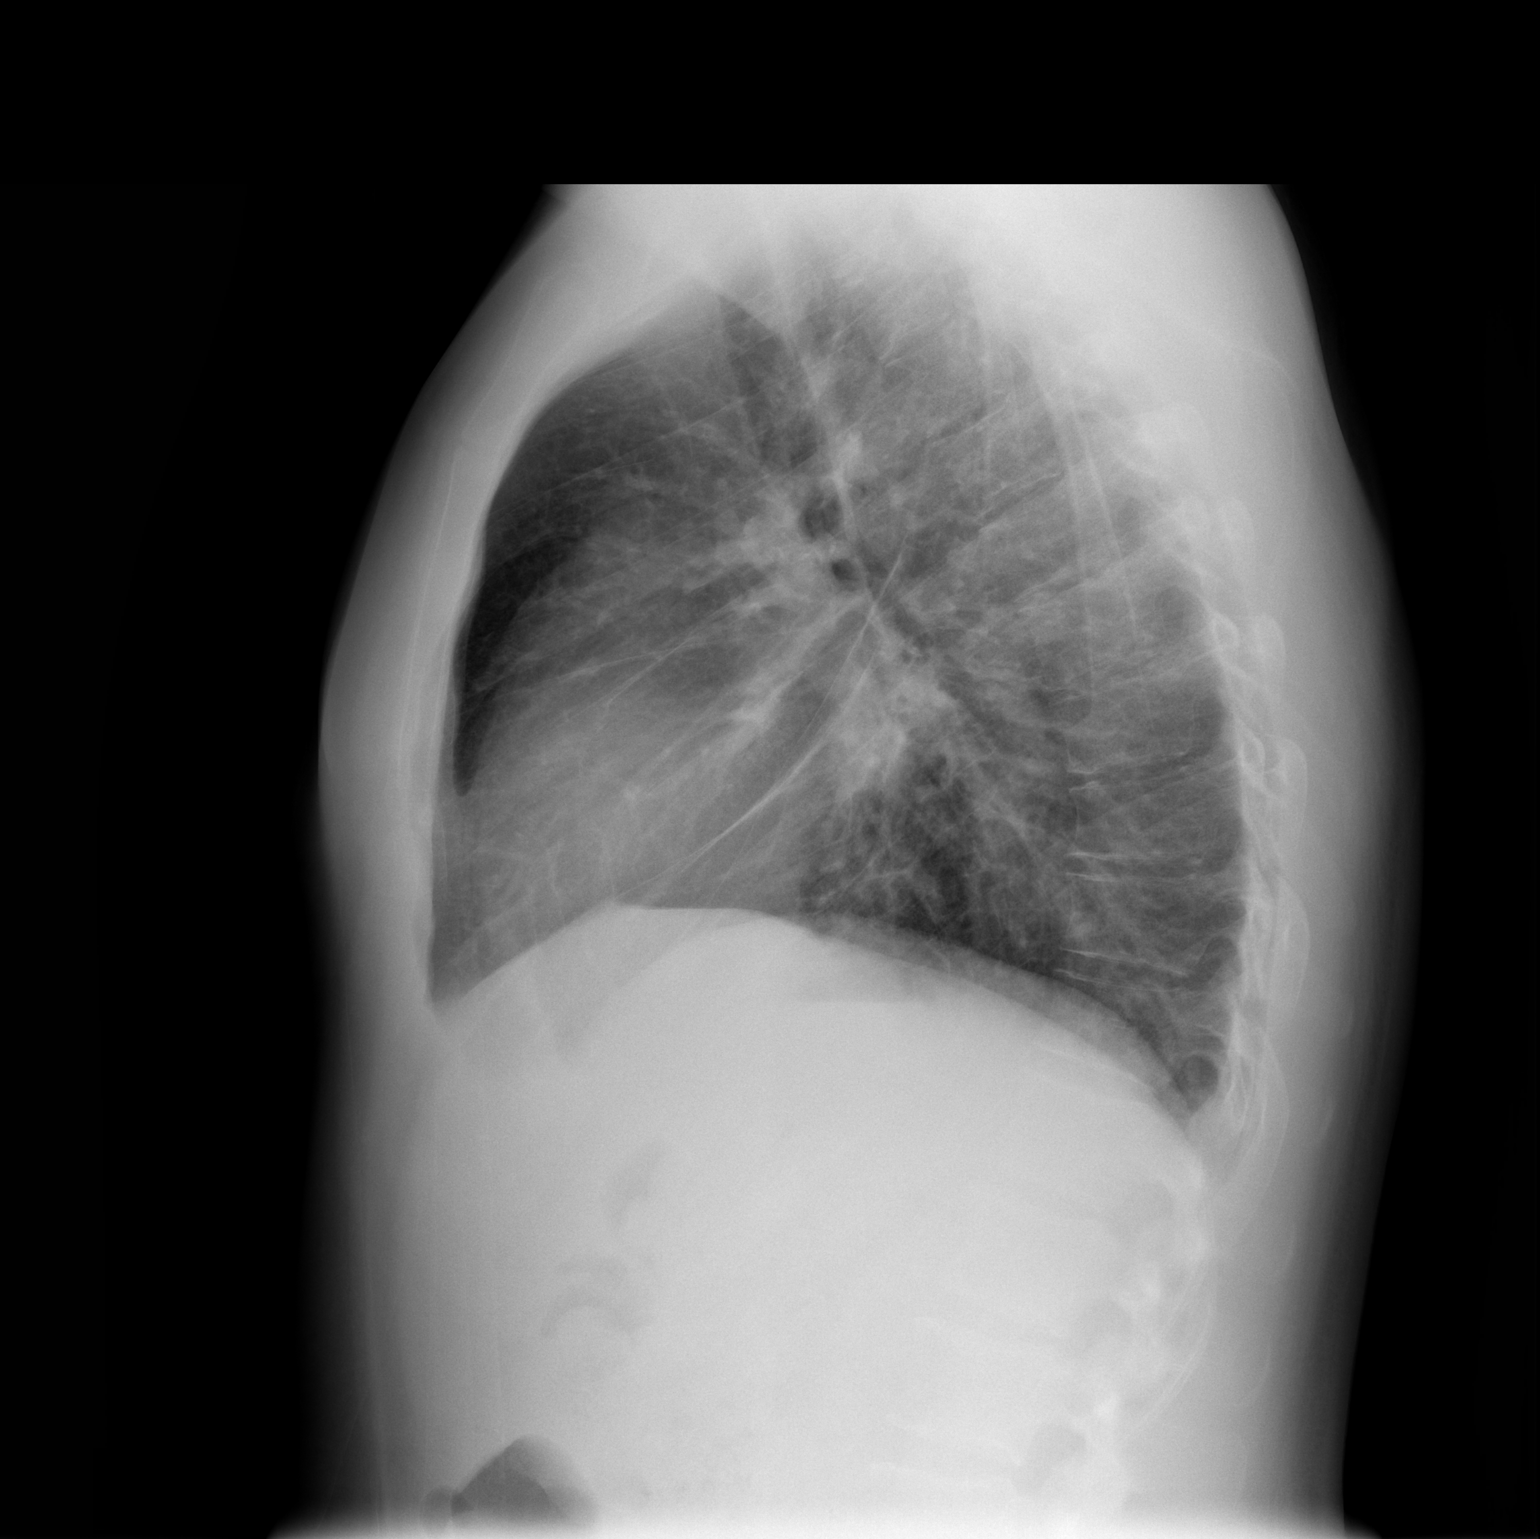

[2 of 2 positions shown; findings below may reference images not displayed]

FINDINGS: Prominent increased perihilar markings suggestive of a
prominent bronchitis type changes without segmental consolidation.
No gross pneumothorax.  Mild central pulmonary vascular prominence.
Heart size within normal limits.
IMPRESSION: Prominent perihilar increased markings suggestive of prominent
bronchitis type changes without segmental consolidation.

Mild central pulmonary vascular prominence.

## 2013-06-25 DIAGNOSIS — N433 Hydrocele, unspecified: Secondary | ICD-10-CM | POA: Insufficient documentation

## 2013-06-25 DIAGNOSIS — N509 Disorder of male genital organs, unspecified: Secondary | ICD-10-CM | POA: Insufficient documentation

## 2013-11-26 ENCOUNTER — Ambulatory Visit (INDEPENDENT_AMBULATORY_CARE_PROVIDER_SITE_OTHER): Payer: Medicare Other | Admitting: Neurology

## 2013-11-26 ENCOUNTER — Encounter: Payer: Self-pay | Admitting: Neurology

## 2013-11-26 VITALS — BP 115/75 | HR 82 | Ht 70.0 in | Wt 200.0 lb

## 2013-11-26 DIAGNOSIS — F419 Anxiety disorder, unspecified: Secondary | ICD-10-CM

## 2013-11-26 DIAGNOSIS — F329 Major depressive disorder, single episode, unspecified: Secondary | ICD-10-CM

## 2013-11-26 DIAGNOSIS — R200 Anesthesia of skin: Secondary | ICD-10-CM | POA: Insufficient documentation

## 2013-11-26 DIAGNOSIS — R208 Other disturbances of skin sensation: Secondary | ICD-10-CM

## 2013-11-26 DIAGNOSIS — I639 Cerebral infarction, unspecified: Secondary | ICD-10-CM

## 2013-11-26 DIAGNOSIS — H540 Blindness, both eyes: Secondary | ICD-10-CM

## 2013-11-26 DIAGNOSIS — F32A Depression, unspecified: Secondary | ICD-10-CM

## 2013-11-26 DIAGNOSIS — E0811 Diabetes mellitus due to underlying condition with ketoacidosis with coma: Secondary | ICD-10-CM

## 2013-11-26 DIAGNOSIS — H547 Unspecified visual loss: Secondary | ICD-10-CM | POA: Insufficient documentation

## 2013-11-26 DIAGNOSIS — G629 Polyneuropathy, unspecified: Secondary | ICD-10-CM

## 2013-11-26 DIAGNOSIS — E119 Type 2 diabetes mellitus without complications: Secondary | ICD-10-CM | POA: Insufficient documentation

## 2013-11-26 NOTE — Progress Notes (Signed)
PATIENT: Fernando Woods DOB: 1975/05/26  HISTORICAL  Fernando Woods is a 38 years old right-handed male, referred by his primary care PA Alvis Lemmings O'Buch for evaluation of sudden onset gait difficulty, he is accompanied by his father  He had a past medical history of type 1 diabetes since age 75, he had a severe complications, including diabetic peripheral neuropathy, went blind in 2011 for diabetic retinopathy, only has light sensitivity in his right eye, he underwent kidney, pancreatic transplantation in 2012 at [facility], recovered very well, reported glucoses under excellent control, he is taking baby aspirin daily  At baseline, he lives at home with his wife, able to work around home without gait difficulty, other than the limitation from his blindness, in November 19 2013, he woke up, noticed this coordination of his right hand, and his right leg, profound gait difficulty ever since,  He denies significant headaches, no nausea or warm again, symptoms has been persistent since its onset, he denies sensory loss, no weakness,  REVIEW OF SYSTEMS: Full 14 system review of systems performed and notable only for numbness, tremor, depression, not enough sleep, too much sleep  ALLERGIES: Allergies  Allergen Reactions  . Sulfonamide Derivatives     REACTION: hives    HOME MEDICATIONS: No current outpatient prescriptions on file prior to visit.   No current facility-administered medications on file prior to visit.    PAST MEDICAL HISTORY: Past Medical History  Diagnosis Date  . Hand numbness   . Right arm numbness   . Right leg numbness   . Diabetes   . Blindness   . Neuropathy   . Depression   . Anxiety     PAST SURGICAL HISTORY: Past Surgical History  Procedure Laterality Date  . Kidney surgery    . Eye surgery Bilateral   . Wisdom tooth extraction    . Combined kidney-pancreas transplant      FAMILY HISTORY: Family History  Problem Relation  Age of Onset  . Heart disease Father   . Breast cancer Mother   . Diabetes type II Mother   . Depression Mother   . Dementia Maternal Grandmother   . Psychiatric Illness      SOCIAL HISTORY:  History   Social History  . Marital Status: Married    Spouse Name: Roselyn Reef    Number of Children: 0  . Years of Education: 77   Occupational History    Not Working    Social History Main Topics  . Smoking status: Never Smoker   . Smokeless tobacco: Never Used  . Alcohol Use: No  . Drug Use: No  . Sexual Activity: Not on file   Other Topics Concern  . Not on file   Social History Narrative   Patient lives at home with his wife Roselyn Reef).    Patient is unemployed.    Education some college.   Caffeine green tea.   Right handed.        PHYSICAL EXAM   Filed Vitals:   11/26/13 0841  BP: 115/75  Pulse: 82  Height: _0  (1.778 m)  Weight: 200 lb (90.719 kg)    Not recorded      Body mass index is 28.7 kg/(m^2).   Generalized: In no acute distress  Neck: Supple, no carotid bruits   Cardiac: Regular rate rhythm  Pulmonary: Clear to auscultation bilaterally  Musculoskeletal: No deformity  Neurological examination  Mentation: Alert oriented to time, place, history taking, and causual  conversation  Cranial nerve II-XII: Pupils were equal round reactive to light. Extraocular movements were full.  Visual field were full on confrontational test. Bilateral fundi were sharp.  Facial sensation and strength were normal. Hearing was intact to finger rubbing bilaterally. Uvula tongue midline.  Head turning and shoulder shrug and were normal and symmetric.Tongue protrusion into cheek strength was normal.  Motor: Normal tone, bulk and strength.  Sensory: length dependent decreased fine touch, pinprick to distal shin, absent toe vibratory sensation.  Coordination: he has ataxia of  finger to nose, heel-to-shin bilaterally, right worse than left, profound truncal  ataxia  Gait: needed assistance to get up from seated position, wide-based, trunk ataxia, cautious gait,  Deep tendon reflexes: Brachioradialis 2/2, biceps 2/2, triceps 2/2, patellar 2/2, Achilles absent, plantar responses were flexor bilaterally.   DIAGNOSTIC DATA (LABS, IMAGING, TESTING) - I reviewed patient records, labs, notes, testing and imaging myself where available.  Lab Results  Component Value Date   WBC 6.0 02/25/2010   HGB 10.3* 02/25/2010   HCT 33.0* 02/25/2010   MCV 87.1 02/25/2010   PLT 103* 02/25/2010      Component Value Date/Time   NA 139 02/25/2010 1834   K 3.8 02/25/2010 1834   CL 100 02/25/2010 1834   CO2 26 02/25/2010 1834   GLUCOSE 174* 02/25/2010 1834   BUN 19 02/25/2010 1834   CREATININE 3.53* 02/25/2010 1834   CALCIUM 8.2* 02/25/2010 1834   PROT 7.2 10/09/2009 2243   ALBUMIN 3.2* 10/09/2009 2243   AST 44* 10/09/2009 2243   ALT 29 10/09/2009 2243   ALKPHOS 248* 10/09/2009 2243   BILITOT 0.7 10/09/2009 2243   GFRNONAA 20* 02/25/2010 1834   GFRAA * 02/25/2010 1834    24        The eGFR has been calculated using the MDRD equation. This calculation has not been validated in all clinical situations. eGFR's persistently <60 mL/min signify possible Chronic Kidney Disease.   No results found for: CHOL, HDL, LDLCALC, LDLDIRECT, TRIG, CHOLHDL No results found for: HGBA1C Lab Results  Component Value Date   VITAMINB12 >2000* 03/21/2009   No results found for: TSH    ASSESSMENT AND PLAN  Fernando Woods is a 38 y.o. male with past medical history of diabetes, complication from diabetes, including bilateral blindness, peripheral neuropathy, presenting with sudden onset gait difficulty, lack of coordination of right arm and leg since November 19 2013.  1. Most suggestive of right cerebellum stroke, 2, keep aspirin 81 mg daily 3. Physical therapy 4. MRI of the brain, return to clinic after MRI   Marcial Pacas, M.D. Ph.D.  [facility] [address] [place], [place] [address] [phone]

## 2013-12-02 ENCOUNTER — Telehealth: Payer: Self-pay | Admitting: Neurology

## 2013-12-02 ENCOUNTER — Ambulatory Visit
Admission: RE | Admit: 2013-12-02 | Discharge: 2013-12-02 | Disposition: A | Discharge status: Home / Self Care | Payer: Medicare Other | Source: Ambulatory Visit | Attending: Neurology | Admitting: Neurology

## 2013-12-02 DIAGNOSIS — E0811 Diabetes mellitus due to underlying condition with ketoacidosis with coma: Secondary | ICD-10-CM

## 2013-12-02 DIAGNOSIS — R200 Anesthesia of skin: Secondary | ICD-10-CM

## 2013-12-02 DIAGNOSIS — I639 Cerebral infarction, unspecified: Secondary | ICD-10-CM

## 2013-12-02 DIAGNOSIS — F32A Depression, unspecified: Secondary | ICD-10-CM

## 2013-12-02 DIAGNOSIS — F329 Major depressive disorder, single episode, unspecified: Secondary | ICD-10-CM

## 2013-12-02 DIAGNOSIS — G629 Polyneuropathy, unspecified: Secondary | ICD-10-CM

## 2013-12-02 DIAGNOSIS — F419 Anxiety disorder, unspecified: Secondary | ICD-10-CM

## 2013-12-02 DIAGNOSIS — I6309 Cerebral infarction due to thrombosis of other precerebral artery: Secondary | ICD-10-CM

## 2013-12-02 DIAGNOSIS — H547 Unspecified visual loss: Secondary | ICD-10-CM

## 2013-12-02 NOTE — Telephone Encounter (Signed)
Received a telephone call from [facility] regarding MRI of the brain. Shows a subacute left thalamic infarct. Discussed with Dr. Pearlean BrownieSethi so that MRI of the brain final report can be dictated this evening for ordering neurologist, Dr. Terrace ArabiaYan, to review in the morning.

## 2013-12-03 DIAGNOSIS — I639 Cerebral infarction, unspecified: Secondary | ICD-10-CM | POA: Insufficient documentation

## 2013-12-03 MED ORDER — CLOPIDOGREL BISULFATE 75 MG PO TABS: 75.0000 mg | ORAL_TABLET | Freq: Every day | ORAL | Status: DC

## 2013-12-03 NOTE — Telephone Encounter (Signed)
I have called patient's wife, related MRI findings, subacute left thalamic, coronal radiata stroke. He was taking aspirin 81 mg daily, I have called in Plavix 75 mg every day, stop aspirin Echocardiogram Ultrasound carotid artery Keep follow-up appointment in November 19

## 2013-12-03 NOTE — Addendum Note (Signed)
Addended by: Levert FeinsteinYAN, Jaycub Noorani on: 12/03/2013 09:31 AM   Modules accepted: Orders

## 2013-12-11 ENCOUNTER — Ambulatory Visit (HOSPITAL_COMMUNITY): Payer: Medicare Other | Attending: Cardiovascular Disease | Admitting: Radiology

## 2013-12-11 DIAGNOSIS — I639 Cerebral infarction, unspecified: Secondary | ICD-10-CM | POA: Insufficient documentation

## 2013-12-11 DIAGNOSIS — E119 Type 2 diabetes mellitus without complications: Secondary | ICD-10-CM | POA: Diagnosis not present

## 2013-12-11 NOTE — Progress Notes (Signed)
Echocardiogram performed.  

## 2013-12-12 ENCOUNTER — Ambulatory Visit (INDEPENDENT_AMBULATORY_CARE_PROVIDER_SITE_OTHER): Payer: Medicare Other | Admitting: Neurology

## 2013-12-12 ENCOUNTER — Telehealth: Payer: Self-pay | Admitting: Neurology

## 2013-12-12 ENCOUNTER — Encounter: Payer: Self-pay | Admitting: Neurology

## 2013-12-12 VITALS — BP 107/70 | HR 89 | Ht 70.0 in | Wt 197.0 lb

## 2013-12-12 DIAGNOSIS — I639 Cerebral infarction, unspecified: Secondary | ICD-10-CM

## 2013-12-12 DIAGNOSIS — E0811 Diabetes mellitus due to underlying condition with ketoacidosis with coma: Secondary | ICD-10-CM

## 2013-12-12 DIAGNOSIS — R269 Unspecified abnormalities of gait and mobility: Secondary | ICD-10-CM | POA: Insufficient documentation

## 2013-12-12 DIAGNOSIS — H547 Unspecified visual loss: Secondary | ICD-10-CM

## 2013-12-12 DIAGNOSIS — H540 Blindness, both eyes: Secondary | ICD-10-CM

## 2013-12-12 DIAGNOSIS — G629 Polyneuropathy, unspecified: Secondary | ICD-10-CM

## 2013-12-12 MED ORDER — ASPIRIN-DIPYRIDAMOLE ER 25-200 MG PO CP12: 1.0000 | ORAL_CAPSULE | Freq: Two times a day (BID) | ORAL | Status: DC

## 2013-12-12 NOTE — Telephone Encounter (Signed)
Fernando Woods, please call patient for normal ECHO

## 2013-12-12 NOTE — Telephone Encounter (Signed)
Patient will be here to see Dr.Yan at 1:00 for follow up. I will give him normal ECHO. Results.

## 2013-12-12 NOTE — Progress Notes (Signed)
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 diabetic retinopathy, only has light sensitivity in his right eye, he underwent kidney, pancreatic transplantation in 2012 at [facility], recovered very well, reported glucoses under excellent control, he is taking baby aspirin daily  At baseline, he lives at home with his wife, able to work around home without gait difficulty, other than the limitation from his blindness, in November 19 2013, he woke up, noticed this coordination of his right hand, and his right leg, profound gait difficulty ever since,  He denies significant headaches, no nausea or warm again, symptoms has been persistent since its onset, he denies sensory loss, no weakness,  Update December 12 2013:  MRI of the brain showed subacute left lateral thalamic/coronary data infarct as well as remote age 46 and left coronary radiata lacunar infarcts. Mild changes of chronic microvascular ischemia, paranasal sinusitis as well as abnormal appearance of the left eye likely changes of chronic diabetic retinopathy.  His gait difficulty has mild improvement, he is on Plavix, but he also reported that he has severe acid reflux, taking protonix 40 mg daily  He also complains of generalized weakness, fatigue, standing up, improved after sitting down, or lying down, he has severe diabetic peripheral neuropathy, distal leg weakness, gait difficulty at baseline  Echocardiogram was normal   REVIEW OF SYSTEMS: Full 14 system review of systems performed and notable only for numbness, tremor, depression, not enough sleep, too much  sleep  ALLERGIES: Allergies  Allergen Reactions  . Morphine And Related Anxiety    Withdrawal type feelings  . Sulfonamide Derivatives     REACTION: hives    HOME MEDICATIONS: Current Outpatient Prescriptions on File Prior to Visit  Medication Sig Dispense Refill  . Acetaminophen (TYLENOL PO) Take by mouth as needed.    . calcitRIOL (ROCALTROL) 0.25 MCG capsule Take 0.25 mcg by mouth daily.    . clopidogrel (PLAVIX) 75 MG tablet Take 1 tablet (75 mg total) by mouth daily. 30 tablet 11  . dapsone 25 MG tablet Take 25 mg by mouth daily.    . fludrocortisone (FLORINEF) 0.1 MG tablet Take 0.1 mcg by mouth daily.  6  . levothyroxine (SYNTHROID) 25 MCG tablet Take 25 mcg by mouth daily before breakfast.    . LORazepam (ATIVAN) 1 MG tablet Take 1 mg by mouth every 8 (eight) hours.    . magnesium chloride (SLOW-MAG) 64 MG TBEC SR tablet Take 1 tablet by mouth as directed.    . mycophenolate (MYFORTIC) 180 MG EC tablet Take 180 mg by mouth 2 (two) times daily.    . pantoprazole (PROTONIX) 40 MG tablet Take 40 mg by mouth daily.    . potassium citrate (UROCIT-K) 10 MEQ (1080 MG) SR tablet Take 10 mEq by mouth 3 (three) times daily with meals.    . predniSONE (STERAPRED UNI-PAK) 5 MG TABS tablet Take 5 mg by mouth as directed.    . tacrolimus (PROGRAF) 1 MG capsule Take 1 mg by mouth 2 (two) times daily.    Marland Kitchen venlafaxine (EFFEXOR) 75 MG tablet Take 75 mg by mouth 3 (three) times daily with  meals.     No current facility-administered medications on file prior to visit.    PAST MEDICAL HISTORY: Past Medical History  Diagnosis Date  . Hand numbness   . Right arm numbness   . Right leg numbness   . Diabetes   . Blindness   . Neuropathy   . Depression   . Anxiety     PAST SURGICAL HISTORY: Past Surgical History  Procedure Laterality Date  . Kidney surgery    . Eye surgery Bilateral   . Wisdom tooth extraction    . Combined kidney-pancreas transplant      FAMILY HISTORY: Family  History  Problem Relation Age of Onset  . Heart disease Father   . Breast cancer Mother   . Diabetes type II Mother   . Depression Mother   . Dementia Maternal Grandmother   . Psychiatric Illness      SOCIAL HISTORY:  History   Social History  . Marital Status: Married    Spouse Name: Roselyn Reef    Number of Children: 0  . Years of Education: 26   Occupational History    Not Working    Social History Main Topics  . Smoking status: Never Smoker   . Smokeless tobacco: Never Used  . Alcohol Use: No  . Drug Use: No  . Sexual Activity: Not on file   Other Topics Concern  . Not on file   Social History Narrative   Patient lives at home with his wife Roselyn Reef).    Patient is unemployed.    Education some college.   Caffeine green tea.   Right handed.        PHYSICAL EXAM   Filed Vitals:   12/12/13 1309  BP: 107/70  Pulse: 89  Height: 5' 10"  (1.778 m)  Weight: 197 lb (89.359 kg)    Not recorded      Body mass index is 28.27 kg/(m^2).   Generalized: In no acute distress  Neck: Supple, no carotid bruits   Cardiac: Regular rate rhythm  Pulmonary: Clear to auscultation bilaterally  Musculoskeletal: No deformity  Neurological examination  Mentation: Alert oriented to time, place, history taking, and causual conversation  Cranial nerve II-XII: Pupils were equal round reactive to light. Extraocular movements were full.  Visual field were full on confrontational test. Bilateral fundi were sharp.  Facial sensation and strength were normal. Hearing was intact to finger rubbing bilaterally. Uvula tongue midline.  Head turning and shoulder shrug and were normal and symmetric.Tongue protrusion into cheek strength was normal.  Motor: He has mild to moderate bilateral ankle dorsiflexion weakness.  Sensory: length dependent decreased fine touch, pinprick to distal shin, absent toe vibratory sensation.  Coordination: he has ataxia of  finger to nose, heel-to-shin  bilaterally, right worse than left, profound truncal ataxia  Gait: needed assistance to get up from seated position, wide-based, trunk ataxia, cautious gait,  Deep tendon reflexes: Brachioradialis 2/2, biceps 2/2, triceps 2/2, patellar 2/2, Achilles absent, plantar responses were flexor bilaterally.   DIAGNOSTIC DATA (LABS, IMAGING, TESTING) - I reviewed patient records, labs, notes, testing and imaging myself where available.  Lab Results  Component Value Date   WBC 6.0 02/25/2010   HGB 10.3* 02/25/2010   HCT 33.0* 02/25/2010   MCV 87.1 02/25/2010   PLT 103* 02/25/2010      Component Value Date/Time   NA 139 02/25/2010 1834   K 3.8 02/25/2010 1834   CL 100 02/25/2010 1834   CO2 26 02/25/2010 1834  GLUCOSE 174* 02/25/2010 1834   BUN 19 02/25/2010 1834   CREATININE 3.53* 02/25/2010 1834   CALCIUM 8.2* 02/25/2010 1834   PROT 7.2 10/09/2009 2243   ALBUMIN 3.2* 10/09/2009 2243   AST 44* 10/09/2009 2243   ALT 29 10/09/2009 2243   ALKPHOS 248* 10/09/2009 2243   BILITOT 0.7 10/09/2009 2243   GFRNONAA 20* 02/25/2010 1834   GFRAA * 02/25/2010 1834    24        The eGFR has been calculated using the MDRD equation. This calculation has not been validated in all clinical situations. eGFR's persistently <60 mL/min signify possible Chronic Kidney Disease.   No results found for: CHOL, HDL, LDLCALC, LDLDIRECT, TRIG, CHOLHDL No results found for: HGBA1C Lab Results  Component Value Date   VITAMINB12 >2000* 03/21/2009   No results found for: TSH    ASSESSMENT AND PLAN  COSIMO SCHERTZER is a 38 y.o. male with past medical history of diabetes, complication from diabetes, including bilateral blindness, peripheral neuropathy, presenting with sudden onset gait difficulty, lack of coordination of right arm and leg since November 19 2013, MRI showed subacute stroke in left lateral thalamic/coronary data infarct as well as remote age 89 and left coronary radiata lacunar infarcts. Mild  changes of chronic microvascular ischemia, paranasal sinusitis as well as abnormal appearance of the left eye likely changes of chronic diabetic retinopathy.  I have switched him from aspirin, to Plavix, however, he has severe acid reflux, is taking 40 mg of Protonix,   1. I have suggested him to Aggrenox 1 tablet twice a day 2. He has symptoms suggestive of orthostatic hypertension,  3, he has gait difficulty, bilateral foot drop, most likely from his severe diabetic peripheral neuropathy, which is at his baseline 4. Home physical therapy 5. Return to clinic in 3 months   Marcial Pacas, M.D. Ph.D.  [facility] [address] [place], [place] [address] [phone]

## 2013-12-16 ENCOUNTER — Telehealth: Payer: Self-pay | Admitting: Neurology

## 2013-12-16 NOTE — Telephone Encounter (Addendum)
Called and spoke to his wife Asher MuirJamie Patient can't keep his aggrenox down (Vomiting ) Aggrenox 200-25 mg one tablet twice daily. Patient has not had RX since Saturday . Patient did go to the ER Over the weekend.  Patient wife states he is fine should we try something else. Patient's wife also aware Dr.Yan was out of the office rest of the afternoon. [place]. Ok to wait for  Dr.Yan to return 12-17-2013

## 2013-12-16 NOTE — Telephone Encounter (Signed)
Patient's wife Asher MuirJamie calling to state that patient is having a reaction to Aggrenox, states that he had to go to the ER over the weekend because he was vomiting for 5 straight hours, please return call and advise.

## 2013-12-17 NOTE — Telephone Encounter (Signed)
I have talked with his wife, he has tried Aggrenox twice, each time, he has violent prolonged nausea, vomiting, required ER visit.  He was taking aspirin 81 mg one he had stroke, I have suggest him to take 328 mg of aspirin  He was taking Protonix for acid reflux, that make Plavix not a good candidate

## 2014-01-22 ENCOUNTER — Ambulatory Visit (INDEPENDENT_AMBULATORY_CARE_PROVIDER_SITE_OTHER): Payer: Medicare Other

## 2014-01-22 DIAGNOSIS — I639 Cerebral infarction, unspecified: Secondary | ICD-10-CM

## 2014-04-03 ENCOUNTER — Ambulatory Visit: Payer: Medicare Other | Admitting: Nurse Practitioner

## 2014-04-07 ENCOUNTER — Ambulatory Visit (INDEPENDENT_AMBULATORY_CARE_PROVIDER_SITE_OTHER): Payer: Medicare Other | Admitting: Nurse Practitioner

## 2014-04-07 ENCOUNTER — Encounter: Payer: Self-pay | Admitting: Nurse Practitioner

## 2014-04-07 VITALS — BP 116/80 | HR 86 | Ht 70.0 in | Wt 192.6 lb

## 2014-04-07 DIAGNOSIS — I639 Cerebral infarction, unspecified: Secondary | ICD-10-CM

## 2014-04-07 DIAGNOSIS — R269 Unspecified abnormalities of gait and mobility: Secondary | ICD-10-CM

## 2014-04-07 DIAGNOSIS — H540 Blindness, both eyes: Secondary | ICD-10-CM | POA: Diagnosis not present

## 2014-04-07 DIAGNOSIS — G629 Polyneuropathy, unspecified: Secondary | ICD-10-CM

## 2014-04-07 DIAGNOSIS — H547 Unspecified visual loss: Secondary | ICD-10-CM

## 2014-04-07 NOTE — Patient Instructions (Addendum)
Continue aspirin 325 daily for secondary stroke prevention Continue Florinef for orthostatic hypotension( blood pressure 116/80 today) Continue home physical therapy for gait difficulty Follow-up in 6 months

## 2014-04-07 NOTE — Progress Notes (Signed)
[facility]  PATIENT: Fernando Woods DOB: 21-Aug-1975   REASON FOR VISIT: follow-up  History of stroke gait abnormality,  Blindness, diabetic peripheral neuropathy, orthostatic hypotension HISTORY FROM:patient , father   HISTORY OF PRESENT ILLNESS:[name], 39 year old male returns for follow-up. He was last seen in this office 12/12/2013 by Dr. Terrace Arabia. He has a history of sudden onset gait difficulty, an MRI of the brain in November 9,2015  showing subacute stroke in the left lateral thalmic coronary radiata and left coronary  lacunar infarcts. Mild changes of chronic microvascular ischemia, paranasal sinusitis as well as abnormal appearance of the left eye likely changes of chronic diabetic retinopathy.2-D echo was normal. Carotid Dopplers without significant stenosis. Patient has multiple medical issues to include history of diabetes and complications from diabetes to include bilateral blindness, peripheral neuropathy. He has a history of pancreatic and kidney transplant in 2012. She has also developed orthostatic hypotension and is on Florinef. His gait difficulty has improved with physical therapy since last seen and he is continuing to get in-home physical therapy. He is unable to tolerate Aggrenox due to significant side effects and he had severe acid reflux on Plavix.he is currently taking aspirin 325 daily. 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, only has light sensitivity in his right eye, he underwent kidney, pancreatic transplantation in 2012 at [facility],  recovered very well, reported glucoses under excellent control, he is taking baby aspirin daily  At baseline, he lives at home with his wife, able to work around home without gait difficulty, other than the limitation from his blindness, in November 19 2013, he woke up, noticed this coordination of his right hand, and his right leg, profound gait difficulty ever since,  He denies significant headaches, no nausea or warm again, symptoms has been persistent since its onset, he denies sensory loss, no weakness,  Update December 12 2013:  MRI of the brain showed subacute left lateral thalamic/coronary data infarct as well as remote age 76 and left coronary radiata lacunar infarcts. Mild changes of chronic microvascular ischemia, paranasal sinusitis as well as abnormal appearance of the left eye likely changes of chronic diabetic retinopathy.  His gait difficulty has mild improvement, he is on Plavix, but he also reported that he has severe acid reflux, taking protonix 40 mg daily  He also complains of generalized weakness, fatigue, standing up, improved after sitting down, or lying down, he has severe diabetic peripheral neuropathy, distal leg weakness, gait difficulty at baseline Echocardiogram was normal   REVIEW OF SYSTEMS: Full 14 system review of systems performed and notable only for those listed, all others are neg:  Constitutional: activity change Cardiovascular: neg Ear/Nose/Throat: neg  Skin: neg Eyes: neg Respiratory: neg Gastroitestinal: neg  Hematology/Lymphatic: neg  Endocrine: neg Musculoskeletal:neg Allergy/Immunology: seasonal allergies Neurological: neg Psychiatric: neg Sleep : neg   ALLERGIES: Allergies  Allergen Reactions  . Morphine And Related Anxiety    Withdrawal type feelings  . Lisinopril Other (See Comments)    HA, affected vision  . Sulfonamide Derivatives     REACTION: hives    HOME MEDICATIONS: Outpatient  Prescriptions Prior to Visit  Medication  Sig Dispense Refill  . Acetaminophen (TYLENOL PO) Take by mouth as needed.    . calcitRIOL (ROCALTROL) 0.25 MCG capsule Take 0.25 mcg by mouth daily.    . dapsone 25 MG tablet Take 25 mg by mouth daily.    . fludrocortisone (FLORINEF) 0.1 MG tablet Take 0.1 mcg by mouth daily.  6  . levothyroxine (SYNTHROID) 25 MCG tablet Take 25 mcg by mouth daily before breakfast.    . LORazepam (ATIVAN) 1 MG tablet Take 1 mg by mouth every 8 (eight) hours.    . magnesium chloride (SLOW-MAG) 64 MG TBEC SR tablet Take 1 tablet by mouth every other day.     . metoCLOPramide (REGLAN) 5 MG tablet Take 5 mg by mouth daily.    . mycophenolate (MYFORTIC) 180 MG EC tablet Take 180 mg by mouth 2 (two) times daily.    . pantoprazole (PROTONIX) 40 MG tablet Take 40 mg by mouth daily.    . potassium citrate (UROCIT-K) 10 MEQ (1080 MG) SR tablet Take 10 mEq by mouth 3 (three) times daily with meals.    . predniSONE (STERAPRED UNI-PAK) 5 MG TABS tablet Take 5 mg by mouth as directed.    . tacrolimus (PROGRAF) 1 MG capsule Take 1 mg by mouth 2 (two) times daily.    Marland Kitchen venlafaxine (EFFEXOR) 75 MG tablet Take 75 mg by mouth 3 (three) times daily with meals.    . dipyridamole-aspirin (AGGRENOX) 200-25 MG per 12 hr capsule Take 1 capsule by mouth 2 (two) times daily. (Patient not taking: Reported on 04/07/2014) 60 capsule 11   No facility-administered medications prior to visit.    PAST MEDICAL HISTORY: Past Medical History  Diagnosis Date  . Hand numbness   . Right arm numbness   . Right leg numbness   . Diabetes   . Blindness   . Neuropathy   . Depression   . Anxiety     PAST SURGICAL HISTORY: Past Surgical History  Procedure Laterality Date  . Kidney surgery    . Eye surgery Bilateral   . Wisdom tooth extraction    . Combined kidney-pancreas transplant      FAMILY HISTORY: Family History  Problem Relation Age of Onset  . Heart disease Father   . Breast cancer Mother   . Diabetes type II Mother   .  Depression Mother   . Dementia Maternal Grandmother   . Psychiatric Illness      SOCIAL HISTORY: History   Social History  . Marital Status: Married    Spouse Name: Asher Muir  . Number of Children: 0  . Years of Education: 13   Occupational History  .      Not Working    Social History Main Topics  . Smoking status: Never Smoker   . Smokeless tobacco: Never Used  . Alcohol Use: No  . Drug Use: No  . Sexual Activity: Not on file   Other Topics Concern  . Not on file   Social History Narrative   Patient lives at home with his wife Asher Muir).    Patient is unemployed.    Education some college.   Caffeine green tea.   Right handed.        PHYSICAL EXAM  Filed Vitals:   04/07/14 1044  BP: 116/80  Pulse: 86  Height:  (1.778 m)  Weight: 192 lb 9.6 oz (87.363 kg)   Body mass index is 27.64 kg/(m^2).  Generalized:  Well developed, in no acute distress  Head: normocephalic and atraumatic,. Oropharynx benign  Neck: Supple, no carotid bruits  Cardiac: Regular rate rhythm, no murmur  Musculoskeletal: No deformity   Neurological examination   Mentation: Alert oriented to time, place, history taking. Attention span and concentration appropriate. Recent and remote memory intact.  Follows all commands speech and language fluent.   Cranial nerve II-XII: Left pupil does not react, right pupil reacts to light. Extraocular movements were full, unable to count fingers due to blindness. Facial sensation and strength were normal. hearing was intact to finger rubbing bilaterally. Uvula tongue midline. head turning and shoulder shrug were normal and symmetric.Tongue protrusion into cheek strength was normal. Motor: he has mild to moderate bilateral ankle dorsiflexion weakness Sensory: length dependent decreased fine touch pinprick to distal shin absent vibratory sensation Coordination: ataxia with finger to nose heel-to-shin right worse than left Reflexes: Brachioradialis 2/2,  biceps 2/2, triceps 2/2, patellar 1/1, Achilles absent plantar responses were flexor bilaterally. Gait and Station: requires assistance to get up in a seated position wide-based cautious gait with rolling walker and 1+ standby assist DIAGNOSTIC DATA (LABS, IMAGING, TESTING) -  ASSESSMENT AND PLAN  39 y.o. year old male  has a past medical history of Diabetes; Blindness; Neuropathy; Depression; and Anxiety. Subacute stroke and abnormality of gait, and orthostatic hypotension here to follow-up.Complications from diabetes include blindness peripheral neuropathy. MRI of the brain November 9 showing subacute stroke in the left lateral thalmic coronary radiata and left coronary  lacunar infarcts. Mild changes of chronic microvascular ischemia, paranasal sinusitis as well as abnormal appearance of the left eye likely changes of chronic diabetic retinopathy.2-D echo was normal. Carotid Dopplers without significant stenosis  Continue aspirin 325 daily for secondary stroke prevention (patient has failed Plavix and Aggrenox due to significant side effects acid reflux and nausea) Continue Florinef for orthostatic hypotension( blood pressure 116/80 today) Continue home physical therapy for gait difficulty,  continue the exercise program once physical therapy dismisses you so you can maintain her baseline Follow-up in 6 months Nilda RiggsNancy Carolyn Jacorion Klem, [facility] LLCGNP, [facility] CenterBC, APRN  [facility] [address], [address] [place], [place] [address] [phone]

## 2014-04-08 NOTE — Progress Notes (Signed)
I have reviewed and agreed above plan. 

## 2014-10-08 ENCOUNTER — Encounter: Payer: Self-pay | Admitting: Nurse Practitioner

## 2014-10-08 ENCOUNTER — Ambulatory Visit (INDEPENDENT_AMBULATORY_CARE_PROVIDER_SITE_OTHER): Payer: Medicare Other | Admitting: Nurse Practitioner

## 2014-10-08 VITALS — BP 151/99 | HR 93 | Ht 70.0 in | Wt 201.0 lb

## 2014-10-08 DIAGNOSIS — I639 Cerebral infarction, unspecified: Secondary | ICD-10-CM

## 2014-10-08 DIAGNOSIS — G629 Polyneuropathy, unspecified: Secondary | ICD-10-CM | POA: Diagnosis not present

## 2014-10-08 DIAGNOSIS — H540 Blindness, both eyes: Secondary | ICD-10-CM

## 2014-10-08 DIAGNOSIS — R269 Unspecified abnormalities of gait and mobility: Secondary | ICD-10-CM | POA: Diagnosis not present

## 2014-10-08 DIAGNOSIS — H547 Unspecified visual loss: Secondary | ICD-10-CM

## 2014-10-08 NOTE — Progress Notes (Signed)
[facility]  PATIENT: Fernando Woods DOB: October 04, 1975   REASON FOR VISIT: Follow-up for history of stroke, abnormality of gait, diabetic peripheral neuropathy, blindness, orthostatic hypotension HISTORY FROM: Patient and father    HISTORY OF PRESENT ILLNESS:Mr. Fernando Woods, 39 year old male returns for follow-up. He was last seen in this office 04/09/14. He has a history of sudden onset gait difficulty, an MRI of the brain in November 9,2015 showing subacute stroke in the left lateral thalmic coronary radiata and left coronary lacunar infarcts. Mild changes of chronic microvascular ischemia, paranasal sinusitis as well as abnormal appearance of the left eye likely changes of chronic diabetic retinopathy.2-D echo was normal. Carotid Dopplers without significant stenosis. Patient has multiple medical issues to include history of diabetes and complications from diabetes to include bilateral blindness, peripheral neuropathy. He has a history of pancreatic and kidney transplant in 2012. She has also developed orthostatic hypotension and is on Florinef and sees MD at [facility] for this.. His gait difficulty has improved with physical therapy and he is continuing to perform -home exercises. He is unable to tolerate Aggrenox due to significant side effects and he had severe acid reflux on Plavix.He is currently taking aspirin 325 daily. He denies further stroke or TIA symptoms.He returns for reevaluation HISTORY:Fernando Woods is a 39 years old right-handed male, referred by his primary care PA Edgardo Roys O'Buch for evaluation of sudden onset gait difficulty, he is accompanied by his father He had a past medical history of type 1 diabetes since age 34, he had a severe complications, including diabetic peripheral neuropathy, went blind in 2011 for diabetic retinopathy, only has light sensitivity in his right eye, he underwent kidney, pancreatic transplantation in 2012 at [facility], recovered very well, reported glucoses under excellent control, he is taking baby aspirin daily At baseline, he lives at home with his wife, able to work around home without gait difficulty, other than the limitation from his blindness, in November 19 2013, he woke up, noticed this coordination of his right hand, and his right leg, profound gait difficulty ever since, He denies significant headaches, no nausea or warm again, symptoms has been persistent since its onset, he denies sensory loss, no weakness,  Update December 12 2013: MRI of the brain showed subacute left lateral thalamic/coronary data infarct as well as remote age 58 and left coronary radiata lacunar infarcts. Mild changes of chronic microvascular ischemia, paranasal sinusitis as well as abnormal appearance of the left eye likely changes of chronic diabetic retinopathy. His gait difficulty has mild improvement, he is on Plavix, but he also reported that he has severe acid reflux, taking protonix 40 mg daily He also complains of generalized weakness, fatigue, standing up, improved after sitting down, or lying down, he has severe diabetic peripheral neuropathy, distal leg weakness, gait difficulty at baseline Echocardiogram was normal  REVIEW OF SYSTEMS: Full 14 system review of systems performed and notable only for those listed, all others are neg:  Constitutional: neg  Cardiovascular: neg Ear/Nose/Throat: neg  Skin: neg Eyes: Blind Respiratory: neg Gastroitestinal: neg  Hematology/Lymphatic: neg  Endocrine: neg Musculoskeletal:neg Allergy/Immunology: neg Neurological: neg Psychiatric: neg Sleep : neg   ALLERGIES: Allergies  Allergen Reactions  . Morphine And Related Anxiety    Withdrawal type feelings  . Lisinopril Other (See Comments)    HA, affected vision  . Sulfonamide Derivatives     REACTION: hives    HOME MEDICATIONS: Outpatient Prescriptions Prior to Visit  Medication Sig Dispense Refill  .  Acetaminophen (TYLENOL PO) Take by mouth as needed.    Marland Kitchen aspirin 325 MG tablet Take 325 mg by mouth daily.    . calcitRIOL (ROCALTROL) 0.25 MCG capsule Take 0.25 mcg by mouth daily.    . dapsone 25 MG tablet Take 25 mg by mouth daily.    . fludrocortisone (FLORINEF) 0.1 MG tablet Take 0.1 mcg by mouth daily.  6  . levothyroxine (SYNTHROID) 25 MCG tablet Take 25 mcg by mouth daily before breakfast.    . LORazepam (ATIVAN) 1 MG tablet Take 1 mg by mouth every 8 (eight) hours as needed.     . magnesium chloride (SLOW-MAG) 64 MG TBEC SR tablet Take 1 tablet by mouth every other day.     . metoCLOPramide (REGLAN) 5 MG tablet Take 5 mg by mouth 3 (three) times daily before meals.     . mycophenolate (MYFORTIC) 180 MG EC tablet Take 180 mg by mouth 2 (two) times daily.    . pantoprazole (PROTONIX) 40 MG tablet Take 40 mg by mouth 2 (two) times daily.     . potassium citrate (UROCIT-K) 10 MEQ (1080 MG) SR tablet Take 10 mEq by mouth 3 (three) times daily with meals.    . tacrolimus (PROGRAF) 1 MG capsule Take 1 mg by mouth 2 (two) times daily.    Marland Kitchen venlafaxine XR (EFFEXOR-XR) 150 MG 24 hr capsule Take 1 capsule by mouth daily.    . predniSONE (STERAPRED UNI-PAK) 5 MG TABS tablet Take 5 mg by mouth as directed.     No facility-administered medications prior to visit.    PAST MEDICAL HISTORY: Past Medical History  Diagnosis Date  . Hand numbness   . Right arm numbness   . Right leg numbness   . Diabetes   . Blindness   . Neuropathy   . Depression   . Anxiety     PAST SURGICAL HISTORY: Past Surgical History  Procedure Laterality Date  . Kidney surgery    . Eye surgery Bilateral   . Wisdom tooth extraction    . Combined kidney-pancreas transplant      FAMILY HISTORY: Family History  Problem Relation Age of Onset  . Heart disease Father   . Breast cancer Mother   . Diabetes type II Mother   . Depression Mother   . Dementia Maternal Grandmother   . Psychiatric Illness       SOCIAL HISTORY: Social History   Social History  . Marital Status: Married    Spouse Name: Asher Muir  . Number of Children: 0  . Years of Education: 13   Occupational History  .      Not Working    Social History Main Topics  . Smoking status: Never Smoker   . Smokeless tobacco: Never Used  . Alcohol Use: No  . Drug Use: No  . Sexual Activity: Not on file   Other Topics Concern  . Not on file   Social History Narrative   Patient lives at home with his wife Asher Muir).    Patient is unemployed.    Education some college.   Caffeine green tea.   Right handed.        PHYSICAL EXAM  Filed Vitals:   10/08/14 1019  BP: 151/99  Pulse: 93  Height: 5\' 10"  (1.778 m)  Weight: 201 lb (91.173 kg)   Body mass index is 28.84 kg/(m^2). Generalized: Well developed, in no acute distress  Head: normocephalic and atraumatic,. Oropharynx benign  Neck: Supple, no  carotid bruits  Cardiac: Regular rate rhythm, no murmur  Musculoskeletal: No deformity   Neurological examination   Mentation: Alert oriented to time, place, history taking. Attention span and concentration appropriate. Recent and remote memory intact. Follows all commands speech and language fluent.   Cranial nerve II-XII: Left pupil does not react, right pupil reacts to light. Extraocular movements were full, unable to count fingers due to blindness. Facial sensation and strength were normal. hearing was intact to finger rubbing bilaterally. Uvula tongue midline. head turning and shoulder shrug were normal and symmetric.Tongue protrusion into cheek strength was normal. Motor: he has mild to moderate bilateral ankle dorsiflexion weakness Sensory: length dependent decreased fine touch pinprick to distal shin absent vibratory sensation Coordination: ataxia with finger to nose heel-to-shin right worse than left Reflexes: Brachioradialis 2/2, biceps 2/2, triceps 2/2, patellar 1/1, Achilles absent plantar responses were  flexor bilaterally. Gait and Station: requires assistance to get up in a seated position wide-based cautious gait with rolling walker and 1+ standby assist DIAGNOSTIC DATA (LABS, IMAGING, TESTING) -  ASSESSMENT AND PLAN 39 y.o. year old male has a past medical history of Diabetes; Blindness; Neuropathy; Depression; and Anxiety. Subacute stroke and abnormality of gait, and orthostatic hypotension here to follow-up.Complications from diabetes include blindness peripheral neuropathy. MRI of the brain November 9 2015showing subacute stroke in the left lateral thalmic coronary radiata and left coronary lacunar infarcts. Mild changes of chronic microvascular ischemia, paranasal sinusitis as well as abnormal appearance of the left eye likely changes of chronic diabetic retinopathy.2-D echo was normal. Carotid Dopplers without significant stenosis  PLANContinue aspirin 325 daily for secondary stroke prevention (patient has failed Plavix and Aggrenox due to significant side effects acid reflux and nausea) Continue Florinef for orthostatic hypotension( blood pressure 151/99 today)Seen in clinic at [facility] Continue home  exercise program for gait abnormality, use rolling walker at all times for safety Follow-up in 8 months, next visit with Dr. Terrace Arabia if stable may discharge from service at that time Nilda Riggs, [facility], [facility], APRN  [facility] [address], [address] [place], [place] [address] [phone]

## 2014-10-08 NOTE — Progress Notes (Signed)
I have reviewed and agreed above plan. 

## 2014-10-08 NOTE — Patient Instructions (Addendum)
Continue aspirin 325 daily for secondary stroke prevention (patient has failed Plavix and Aggrenox due to significant side effects acid reflux and nausea) Continue Florinef for orthostatic hypotension( blood pressure 151/99 today) Continue home  exercise program for gait abnormality, use walker at all times for safety Follow-up in 8 months, next visit with Dr. Terrace Arabia

## 2015-06-08 ENCOUNTER — Ambulatory Visit: Payer: Medicare Other | Admitting: Neurology

## 2015-07-15 ENCOUNTER — Ambulatory Visit (INDEPENDENT_AMBULATORY_CARE_PROVIDER_SITE_OTHER): Payer: Medicare Other | Admitting: Neurology

## 2015-07-15 ENCOUNTER — Encounter: Payer: Self-pay | Admitting: Neurology

## 2015-07-15 VITALS — BP 123/85 | HR 84 | Ht 70.0 in | Wt 216.5 lb

## 2015-07-15 DIAGNOSIS — Z794 Long term (current) use of insulin: Secondary | ICD-10-CM | POA: Diagnosis not present

## 2015-07-15 DIAGNOSIS — E0811 Diabetes mellitus due to underlying condition with ketoacidosis with coma: Secondary | ICD-10-CM | POA: Diagnosis not present

## 2015-07-15 DIAGNOSIS — I639 Cerebral infarction, unspecified: Secondary | ICD-10-CM | POA: Diagnosis not present

## 2015-07-15 DIAGNOSIS — R269 Unspecified abnormalities of gait and mobility: Secondary | ICD-10-CM | POA: Diagnosis not present

## 2015-07-15 MED ORDER — GABAPENTIN 100 MG PO CAPS: 200.0000 mg | ORAL_CAPSULE | Freq: Three times a day (TID) | ORAL | Status: AC

## 2015-07-15 MED ORDER — LIDOCAINE 5 % EX OINT: 1.0000 "application " | TOPICAL_OINTMENT | CUTANEOUS | Status: DC | PRN

## 2015-07-15 NOTE — Progress Notes (Signed)
Chief Complaint  Patient presents with  . Cerebrovascular Accident    He has been in [facility], multiple times, recently for falls, decreased appetite, constipation, bladder infection, sepsis and shingles.  He is doing better now and has restarted both PT and OT.  Feels he is getting stronger.  He is blind and is using a rolling walker to assist with ambulation.     [facility]  PATIENT: Fernando Woods DOB: [birth year]  HISTORY OF PRESENT ILLNESS:Fernando Woods, 40 year old male returns for follow-up. He was last seen in this office 04/09/14. He has a history of sudden onset gait difficulty, an MRI of the brain in November 9,2015 showing subacute stroke in the left lateral thalamic extending to coronary radiata lacunar infarcts. Mild changes of chronic microvascular ischemia, paranasal sinusitis as well as abnormal appearance of the left eye likely changes of chronic diabetic retinopathy.2-D echo was normal. C. His gait difficulty has improved with physical therapy and he is continuing to perform -home exercises. He is unable to tolerate Aggrenox due to significant side effects and he had severe acid reflux on Plavix.He is currently taking aspirin 325 daily. He denies further stroke or TIA symptoms.He returns for reevaluation HISTORY:Fernando Woods is a 40 years old right-handed male, referred by his primary care PA Edgardo Roys O'Buch for evaluation of sudden onset gait difficulty, he is accompanied by his father in Nov 2015. He had a past medical history of type 1 diabetes since age 80, he had a severe complications, including diabetic peripheral neuropathy, went blind in 2011 from diabetic retinopathy, only has light sensitivity in his right eye, he underwent kidney, pancreatic transplantation in 2012 at [facility], recovered very well, reported glucoses under excellent control, he is taking baby aspirin daily At baseline, he lives at home with his wife, able to  work around home without gait difficulty, other than the limitation from his blindness, in November 19 2013, he woke up, noticed lost of coordination of his right hand, and his right leg, profound gait difficulty ever since,  MRI of the brain showed subacute left lateral thalamic/coronary radiata infarction. Mild changes of chronic microvascular ischemia, paranasal sinusitis as well as abnormal appearance of the left eye likely changes of chronic diabetic retinopathy. His gait difficulty has mild improvement, he is on Plavix, but he also reported that he has severe acid reflux, taking protonix 40 mg daily Echocardiogram was normal, ultrasound of carotid artery showed no significant abnormality, He also has diabetic peripheral neuropathy, orthostatic hypotension, seen physician at [facility] for this, he is taking fludrocortisone 1 tablet a day UPDATE July 15 2015: He had incomplete recovery from his stroke in 2015, began to rely on his walker, over the past 2 years, he had different course of physical therapy, which has helped his stamina.  He developed right V1 shingle on May 2017, require hospital admission to [facility] for constipation, poor appetite, UTI, sepsis, he was noted to be confused, when he tried to stand up during his hospital case, he fell down with left frontal abrasion.   He  has developed right forehead neuropathic pain, is taking gabapentin  tid, with help, but often not adequate. He still has right forehead area itching burning numbness sensation especially during early morning time.   He has been taking Metanx for his diabetic peripheral neuropathy, which has been very helpful, he noticed a change, increased bilateral lower extremity paresthesia, if he missed his medications, he also has increased  energy with Metanx supplement  REVIEW OF SYSTEMS: Full 14 system review of systems performed and notable only for those listed, all others are neg: Fatigue,  excessive sweating, facial swelling, loss of vision, cough, daytime sleepiness, sleep talking, environmental allergy, achy muscles, itching, dizziness, numbness, tremor, depression anxiety      ALLERGIES: Allergies  Allergen Reactions  . Morphine And Related Anxiety    Withdrawal type feelings  . Lisinopril Other (See Comments)    HA, affected vision  . Sulfonamide Derivatives     REACTION: hives    HOME MEDICATIONS: Outpatient Prescriptions Prior to Visit  Medication Sig Dispense Refill  . Acetaminophen (TYLENOL PO) Take by mouth as needed.    Marland Kitchen. aspirin 325 MG tablet Take 325 mg by mouth daily.    . calcitRIOL (ROCALTROL) 0.25 MCG capsule Take 0.25 mcg by mouth daily.    . dapsone 25 MG tablet Take 25 mg by mouth daily.    . fludrocortisone (FLORINEF) 0.1 MG tablet Take 0.1 mcg by mouth daily.  6  . levothyroxine (SYNTHROID) 25 MCG tablet Take 25 mcg by mouth daily before breakfast.    . LORazepam (ATIVAN) 1 MG tablet Take 1 mg by mouth every 8 (eight) hours as needed.     . magnesium chloride (SLOW-MAG) 64 MG TBEC SR tablet Take 1 tablet by mouth every other day.     . metoCLOPramide (REGLAN) 5 MG tablet Take 5 mg by mouth 3 (three) times daily before meals.     . mycophenolate (MYFORTIC) 180 MG EC tablet Take 180 mg by mouth 2 (two) times daily.    . pantoprazole (PROTONIX) 40 MG tablet Take 40 mg by mouth 2 (two) times daily.     . potassium citrate (UROCIT-K) 10 MEQ (1080 MG) SR tablet Take 10 mEq by mouth 3 (three) times daily with meals.    . predniSONE (DELTASONE) 5 MG tablet Take 5 mg by mouth daily with breakfast.   6  . tacrolimus (PROGRAF) 1 MG capsule Take 1 mg by mouth 2 (two) times daily.    Marland Kitchen. venlafaxine XR (EFFEXOR-XR) 150 MG 24 hr capsule Take 1 capsule by mouth daily.     No facility-administered medications prior to visit.    PAST MEDICAL HISTORY: Past Medical History  Diagnosis Date  . Hand numbness   . Right arm numbness   . Right leg numbness   .  Diabetes (HCC)   . Blindness   . Neuropathy (HCC)   . Depression   . Anxiety   . Sepsis (HCC)   . Shingles   . Urinary retention     PAST SURGICAL HISTORY: Past Surgical History  Procedure Laterality Date  . Kidney surgery    . Eye surgery Bilateral   . Wisdom tooth extraction    . Combined kidney-pancreas transplant      FAMILY HISTORY: Family History  Problem Relation Age of Onset  . Heart disease Father   . Breast cancer Mother   . Diabetes type II Mother   . Depression Mother   . Dementia Maternal Grandmother   . Psychiatric Illness      SOCIAL HISTORY: Social History   Social History  . Marital Status: Married    Spouse Name: Asher MuirJamie  . Number of Children: 0  . Years of Education: 13   Occupational History  .      Not Working    Social History Main Topics  . Smoking status: Never Smoker   . Smokeless  tobacco: Never Used  . Alcohol Use: No  . Drug Use: No  . Sexual Activity: Not on file   Other Topics Concern  . Not on file   Social History Narrative   Patient lives at home with his wife Asher Muir).    Patient is unemployed.    Education some college.   Caffeine green tea.   Right handed.        PHYSICAL EXAM  Filed Vitals:   07/15/15 1113  BP: 123/85  Pulse: 84  Height: 5\' 10"  (1.778 m)  Weight: 216 lb 8 oz (98.204 kg)   Body mass index is 31.06 kg/(m^2). Generalized: Well developed, in no acute distress  Head: normocephalic and atraumatic,. Oropharynx benign  Neck: Supple, no carotid bruits  Cardiac: Regular rate rhythm, no murmur  Musculoskeletal: No deformity   Neurological examination   Mentation: Alert oriented to time, place, history taking. Attention span and concentration appropriate. Recent and remote memory intact. Follows all commands speech and language fluent.   Cranial nerve II-XII: Left pupil does not react, right pupil reacts to light. Extraocular movements were full, unable to count fingers due to blindness.  Facial sensation and strength were normal. hearing was intact to finger rubbing bilaterally. Uvula tongue midline. head turning and shoulder shrug were normal and symmetric.Tongue protrusion into cheek strength was normal. Motor: he has mild to moderate bilateral ankle dorsiflexion weakness Sensory: length dependent decreased fine touch pinprick and vibratory sensation to bilateral knee level Coordination: ataxia with finger to nose heel-to-shin right worse than left Reflexes: absent,  plantar responses were flexor bilaterally. Gait and Station: requires assistance to get up in a seated position wide-based cautious gait with rolling walker and bilateral feet drop DIAGNOSTIC DATA (LABS, IMAGING, TESTING)  ASSESSMENT AND PLAN 40 y.o. 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 Ph.D.  [facility] [address] [place], [place] [address] Phone: [phone] Fax:      [phone]

## 2015-07-20 ENCOUNTER — Telehealth: Payer: Self-pay | Admitting: Neurology

## 2015-07-20 NOTE — Telephone Encounter (Signed)
Vernona RiegerLaura with [name], [address] [place]  is calling to let us know they do not have a supplier for Rx lidocaine 5%.  Please order from another pharmacy or is there another medication that would work.

## 2015-07-20 NOTE — Telephone Encounter (Signed)
Spoke to [name] (wife on [employer]) - [facility] and then request [name] to transfer the prescription, when she decides on the location.  I told her to please call us back with any problems.

## 2015-07-20 NOTE — Telephone Encounter (Signed)
Left messages at both numbers listed in chart - need to discuss the call received from [facility].  We are happy to send the rx to another pharmacy of his choice.

## 2015-10-21 ENCOUNTER — Encounter: Payer: Self-pay | Admitting: Neurology

## 2015-10-21 ENCOUNTER — Ambulatory Visit (INDEPENDENT_AMBULATORY_CARE_PROVIDER_SITE_OTHER): Payer: Medicare Other | Admitting: Neurology

## 2015-10-21 VITALS — BP 102/71 | HR 99 | Ht 70.0 in | Wt 223.5 lb

## 2015-10-21 DIAGNOSIS — B0229 Other postherpetic nervous system involvement: Secondary | ICD-10-CM

## 2015-10-21 DIAGNOSIS — I639 Cerebral infarction, unspecified: Secondary | ICD-10-CM

## 2015-10-21 DIAGNOSIS — G629 Polyneuropathy, unspecified: Secondary | ICD-10-CM | POA: Diagnosis not present

## 2015-10-21 NOTE — Progress Notes (Signed)
Chief Complaint  Patient presents with  . Cerebrovascular Accident    He continues to have an unsteady gait and uses a rolling walker for assistance.  Marland Kitchen Herpes Zoster    He continues to have pain, in his forehead and scalp, from his shingles.  He is still taking gabapentin 200mg , TID.     [facility]  PATIENT: Fernando Woods DOB: November 04, 1975  HISTORY OF PRESENT ILLNESS:Fernando Woods, 40 year old male returns for follow-up. He was last seen in this office 04/09/14. He has a history of sudden onset gait difficulty, an MRI of the brain in November 9,2015 showing subacute stroke in the left lateral thalamic extending to coronary radiata lacunar infarcts. Mild changes of chronic microvascular ischemia, paranasal sinusitis as well as abnormal appearance of the left eye likely changes of chronic diabetic retinopathy.2-D echo was normal. C. His gait difficulty has improved with physical therapy and he is continuing to perform -home exercises. He is unable to tolerate Aggrenox due to significant side effects and he had severe acid reflux on Plavix.He is currently taking aspirin 325 daily. He denies further stroke or TIA symptoms.He returns for reevaluation HISTORY:Fernando Woods is a 40 years old right-handed male, referred by his primary care PA Edgardo Roys O'Buch for evaluation of sudden onset gait difficulty, he is accompanied by his father in Nov 2015. He had a past medical history of type 1 diabetes since age 73, he had a severe complications, including diabetic peripheral neuropathy, went blind in 2011 from diabetic retinopathy, only has light sensitivity in his right eye, he underwent kidney, pancreatic transplantation in 2012 at [facility], recovered very well, reported glucoses under excellent control, he is taking baby aspirin daily At baseline, he lives at home with his wife, able to work around home without gait difficulty, other than the limitation from his  blindness, in November 19 2013, he woke up, noticed lost of coordination of his right hand, and his right leg, profound gait difficulty ever since,  MRI of the brain showed subacute left lateral thalamic/coronary radiata infarction. Mild changes of chronic microvascular ischemia, paranasal sinusitis as well as abnormal appearance of the left eye likely changes of chronic diabetic retinopathy. His gait difficulty has mild improvement, he is on Plavix, but he also reported that he has severe acid reflux, taking protonix 40 mg daily Echocardiogram was normal, ultrasound of carotid artery showed no significant abnormality, He also has diabetic peripheral neuropathy, orthostatic hypotension, seen physician at [facility] for this, he is taking fludrocortisone 1 tablet a day UPDATE July 15 2015: He had incomplete recovery from his stroke in 2015, began to rely on his walker, over the past 2 years, he had different course of physical therapy, which has helped his stamina.  He developed right V1 shingle on May 2017, require hospital admission to [facility] for constipation, poor appetite, UTI, sepsis, he was noted to be confused, when he tried to stand up during his hospital case, he fell down with left frontal abrasion.   He  has developed right forehead neuropathic pain, is taking gabapentin 100mg  tid, with help, but often not adequate. He still has right forehead area itching burning numbness sensation especially during early morning time.   He has been taking Metanx for his diabetic peripheral neuropathy, which has been very helpful, he noticed a change, increased bilateral lower extremity paresthesia, if he missed his medications, he also has increased energy with Metanx supplement  UPDATE Sept 27th 2017: He has  right frontal area postherpatic neuragial,   REVIEW OF SYSTEMS: Full 14 system review of systems performed and notable only for those listed, all others are neg: Fatigue,  excessive sweating, facial swelling, loss of vision, cough, daytime sleepiness, sleep talking, environmental allergy, achy muscles, itching, dizziness, numbness, tremor, depression anxiety      ALLERGIES: Allergies  Allergen Reactions  . Morphine And Related Anxiety    Withdrawal type feelings  . Lisinopril Other (See Comments)    HA, affected vision  . Sulfonamide Derivatives     REACTION: hives    HOME MEDICATIONS: Outpatient Medications Prior to Visit  Medication Sig Dispense Refill  . Acetaminophen (TYLENOL PO) Take by mouth as needed.    Marland Kitchen. aspirin 325 MG tablet Take 325 mg by mouth daily.    . calcitRIOL (ROCALTROL) 0.25 MCG capsule Take 0.25 mcg by mouth daily.    . dapsone 25 MG tablet Take 25 mg by mouth daily.    . fludrocortisone (FLORINEF) 0.1 MG tablet Take 0.1 mcg by mouth daily.  6  . gabapentin (NEURONTIN) 100 MG capsule Take 2 capsules (200 mg total) by mouth 3 (three) times daily. 180 capsule 11  . levothyroxine (SYNTHROID) 25 MCG tablet Take 25 mcg by mouth daily before breakfast.    . LORazepam (ATIVAN) 1 MG tablet Take 1 mg by mouth every 8 (eight) hours as needed.     . magnesium chloride (SLOW-MAG) 64 MG TBEC SR tablet Take 1 tablet by mouth every other day.     . metoCLOPramide (REGLAN) 5 MG tablet Take 5 mg by mouth 3 (three) times daily before meals.     . mycophenolate (MYFORTIC) 180 MG EC tablet Take 180 mg by mouth 2 (two) times daily.    . pantoprazole (PROTONIX) 40 MG tablet Take 40 mg by mouth 2 (two) times daily.     . potassium citrate (UROCIT-K) 10 MEQ (1080 MG) SR tablet Take 10 mEq by mouth 3 (three) times daily with meals.    . predniSONE (DELTASONE) 5 MG tablet Take 5 mg by mouth daily with breakfast.   6  . tacrolimus (PROGRAF) 1 MG capsule Take 1 mg by mouth 2 (two) times daily.    . tamsulosin (FLOMAX) 0.4 MG CAPS capsule Take 0.4 mg by mouth.    . venlafaxine XR (EFFEXOR-XR) 150 MG 24 hr capsule Take 1 capsule by mouth daily.    Marland Kitchen. lidocaine  (XYLOCAINE) 5 % ointment Apply 1 application topically as needed. 30 g 11  . Silodosin (RAPAFLO PO) Take by mouth daily.     No facility-administered medications prior to visit.     PAST MEDICAL HISTORY: Past Medical History:  Diagnosis Date  . Anxiety   . Blindness   . Depression   . Diabetes (HCC)   . Hand numbness   . Neuropathy (HCC)   . Right arm numbness   . Right leg numbness   . Sepsis (HCC)   . Shingles   . Urinary retention     PAST SURGICAL HISTORY: Past Surgical History:  Procedure Laterality Date  . COMBINED KIDNEY-PANCREAS TRANSPLANT    . EYE SURGERY Bilateral   . KIDNEY SURGERY    . WISDOM TOOTH EXTRACTION      FAMILY HISTORY: Family History  Problem Relation Age of Onset  . Heart disease Father   . Breast cancer Mother   . Diabetes type II Mother   . Depression Mother   . Dementia Maternal Grandmother   . Psychiatric  Illness      SOCIAL HISTORY: Social History   Social History  . Marital status: Married    Spouse name: Asher Muir  . Number of children: 0  . Years of education: 11   Occupational History  .      Not Working    Social History Main Topics  . Smoking status: Never Smoker  . Smokeless tobacco: Never Used  . Alcohol use No  . Drug use: No  . Sexual activity: Not on file   Other Topics Concern  . Not on file   Social History Narrative   Patient lives at home with his wife Asher Muir).    Patient is unemployed.    Education some college.   Caffeine green tea.   Right handed.        PHYSICAL EXAM  Vitals:   10/21/15 1408  BP: 102/71  Pulse: 99  Weight: 223 lb 8 oz (101.4 kg)  Height: 5\' 10"  (1.778 m)   Body mass index is 32.07 kg/m. Generalized: Well developed, in no acute distress  Head: normocephalic and atraumatic,. Oropharynx benign  Neck: Supple, no carotid bruits  Cardiac: Regular rate rhythm, no murmur  Musculoskeletal: No deformity   Neurological examination   Mentation: Alert oriented to time,  place, history taking. Attention span and concentration appropriate. Recent and remote memory intact. Follows all commands speech and language fluent.   Cranial nerve II-XII: Left pupil does not react, right pupil reacts to light. Extraocular movements were full, unable to count fingers due to blindness. Facial sensation and strength were normal. hearing was intact to finger rubbing bilaterally. Uvula tongue midline. head turning and shoulder shrug were normal and symmetric.Tongue protrusion into cheek strength was normal. Motor: he has mild to moderate bilateral ankle dorsiflexion weakness Sensory: length dependent decreased fine touch pinprick and vibratory sensation to bilateral knee level Coordination: ataxia with finger to nose heel-to-shin right worse than left Reflexes: absent,  plantar responses were flexor bilaterally. Gait and Station: requires assistance to get up in a seated position wide-based cautious gait with rolling walker and bilateral feet drop DIAGNOSTIC DATA (LABS, IMAGING, TESTING)  ASSESSMENT AND PLAN 40 y.o. 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 Ph.D.  [facility] [address] [place], [place] [address] Phone: [phone] Fax:      [phone]

## 2016-10-25 NOTE — Progress Notes (Signed)
[facility]  PATIENT: CHRISTIE COPLEY DOB: 04-20-1975   REASON FOR VISIT: Follow-up for postherpetic neuralgia, prior history of stroke in 2015, patient is blind   HISTORY FROM:Patient     HISTORY OF PRESENT ILLNESS:HISTORY OF PRESENT ILLNESS:Mr. Nardelli, 41 year old male returns for follow-up. He was last seen in this office 04/09/14. He has a history of sudden onset gait difficulty, an MRI of the brain in November 9,2015 showing subacute stroke in the left lateral thalamic extending to coronary radiata lacunar infarcts. Mild changes of chronic microvascular ischemia, paranasal sinusitis as well as abnormal appearance of the left eye likely changes of chronic diabetic retinopathy.2-D echo was normal. C. His gait difficulty has improved with physical therapy and he is continuing to perform -home exercises. He is unable to tolerate Aggrenox due to significant side effects and he had severe acid reflux on Plavix.He is currently taking aspirin 325 daily. He denies further stroke or TIA symptoms.He returns for reevaluation HISTORY:Ines C Desroches is a 41 years old right-handed male, referred by his primary care PA Alvis Lemmings O'Buch for evaluation of sudden onset gait difficulty, he is accompanied by his father in Nov 2015. He had a past medical history of type 1 diabetes since age 78, he had a severe complications, including diabetic peripheral neuropathy, went blind in 2011 from diabetic retinopathy, only has light sensitivity in his right eye, he underwent kidney, pancreatic transplantation in 2012 at [facility], recovered very well, reported glucoses under excellent control, he is taking baby aspirin daily At baseline, he lives at home with his wife, able to work around home without gait difficulty, other than the limitation from his blindness, in November 19 2013, he woke up, noticed lost of coordination of his right hand, and his right leg, profound gait difficulty  ever since,  MRI of the brain showed subacute left lateral thalamic/coronary radiata infarction. Mild changes of chronic microvascular ischemia, paranasal sinusitis as well as abnormal appearance of the left eye likely changes of chronic diabetic retinopathy. His gait difficulty has mild improvement, he is on Plavix, but he also reported that he has severe acid reflux, taking protonix 40 mg daily Echocardiogram was normal, ultrasound of carotid artery showed no significant abnormality, He also has diabetic peripheral neuropathy, orthostatic hypotension, seen physician at [facility] for this, he is taking fludrocortisone 1 tablet a day UPDATE July 15 2015: He had incomplete recovery from his stroke in 2015, began to rely on his walker, over the past 2 years, he had different course of physical therapy, which has helped his stamina.  He developed right V1 shingle on May 2017, require hospital admission to [facility] for constipation, poor appetite, UTI, sepsis, he was noted to be confused, when he tried to stand up during his hospital case, he fell down with left frontal abrasion.   He  has developed right forehead neuropathic pain, is taking gabapentin 143m tid, with help, but often not adequate. He still has right forehead area itching burning numbness sensation especially during early morning time.   He has been taking Metanx for his diabetic peripheral neuropathy, which has been very helpful, he noticed a change, increased bilateral lower extremity paresthesia, if he missed his medications, he also has increased energy with Metanx supplement  UPDATE Sept 27th 2017:YY He has right frontal area postherpatic neuragial,   UPDATE10/3/ 2018CM Mr. DEdenfield 41year old male returns for follow-up with past medical history of stroke in 2015, he was unable to tolerate Aggrenox  or Plavix and so is currently on aspirin 325 daily without recurrent stroke or TIA symptoms. He has been a  diabetic since the age of 61 with severe complications, he has been blind since 2011 from diabetic retinopathy he had kidney and pancreatic transplant in 2012. He continues to have gait abnormality for stroke and walks with a rolling walker and standby assist. He is currently receiving some physical therapy,  he had shingles in 2017 and continues to have some postherpetic neuralgia and bilateral lower extremity paresthesias. He is currently on gabapentin with good control of his symptoms. He has previously been seen in this office by Dr. Krista Blue. He is currently residing at [facility] in [place]. He is going through a divorce. He returns for reevaluation REVIEW OF SYSTEMS: Full 14 system review of systems performed and notable only for those listed, all others are neg:  Constitutional: neg  Cardiovascular: neg Ear/Nose/Throat: neg  Skin: neg Eyes: Blind Respiratory: neg Gastroitestinal: neg  Hematology/Lymphatic: neg  Endocrine: neg Musculoskeletal:neg Allergy/Immunology: neg Neurological: Postherpetic neuralgia Psychiatric: Depression and anxiety Sleep : neg   ALLERGIES: Allergies  Allergen Reactions  . Morphine And Related Anxiety    Withdrawal type feelings  . Lisinopril Other (See Comments)    HA, affected vision  . Sulfonamide Derivatives     REACTION: hives    HOME MEDICATIONS: Outpatient Medications Prior to Visit  Medication Sig Dispense Refill  . Acetaminophen (TYLENOL PO) Take by mouth as needed.    Marland Kitchen aspirin 325 MG tablet Take 325 mg by mouth daily.    . calcitRIOL (ROCALTROL) 0.25 MCG capsule Take 0.25 mcg by mouth daily.    . dapsone 25 MG tablet Take 50 mg by mouth daily.     . fludrocortisone (FLORINEF) 0.1 MG tablet Take 0.1 mcg by mouth 3 (three) times daily.   6  . gabapentin (NEURONTIN) 100 MG capsule Take 2 capsules (200 mg total) by mouth 3 (three) times daily. 180 capsule 11  . levothyroxine (SYNTHROID) 25 MCG tablet Take 25 mcg by mouth daily  before breakfast.    . LORazepam (ATIVAN) 1 MG tablet Take 1 mg by mouth every 6 (six) hours as needed.     . magnesium chloride (SLOW-MAG) 64 MG TBEC SR tablet Take 1 tablet by mouth every other day.     . metoCLOPramide (REGLAN) 5 MG tablet Take 5 mg by mouth 3 (three) times daily before meals.     . mycophenolate (MYFORTIC) 180 MG EC tablet Take 360 mg by mouth 2 (two) times daily.     . pantoprazole (PROTONIX) 40 MG tablet Take 40 mg by mouth 2 (two) times daily.     . potassium citrate (UROCIT-K) 10 MEQ (1080 MG) SR tablet Take 30 mEq by mouth daily.     . predniSONE (DELTASONE) 5 MG tablet Take 5 mg by mouth daily with breakfast.   6  . tacrolimus (PROGRAF) 1 MG capsule Take 3 mg by mouth 2 (two) times daily.     Marland Kitchen venlafaxine XR (EFFEXOR-XR) 150 MG 24 hr capsule Take 225 mg by mouth daily.     . tamsulosin (FLOMAX) 0.4 MG CAPS capsule Take 0.4 mg by mouth.     No facility-administered medications prior to visit.     PAST MEDICAL HISTORY: Past Medical History:  Diagnosis Date  . Anxiety   . Blindness   . Depression   . Diabetes ([place])   . Hand numbness   . Neuropathy   .  Right arm numbness   . Right leg numbness   . Sepsis ([place])   . Shingles   . Urinary retention     PAST SURGICAL HISTORY: Past Surgical History:  Procedure Laterality Date  . COMBINED KIDNEY-PANCREAS TRANSPLANT    . EYE SURGERY Bilateral   . KIDNEY SURGERY    . WISDOM TOOTH EXTRACTION      FAMILY HISTORY: Family History  Problem Relation Age of Onset  . Heart disease Father   . Breast cancer Mother   . Diabetes type II Mother   . Depression Mother   . Dementia Maternal Grandmother   . Psychiatric Illness Unknown     SOCIAL HISTORY: Social History   Social History  . Marital status: Married    Spouse name: Roselyn Reef  . Number of children: 0  . Years of education: 72   Occupational History  .      Not Working    Social History Main Topics  . Smoking status: Never Smoker  . Smokeless  tobacco: Never Used  . Alcohol use No  . Drug use: No  . Sexual activity: Not on file   Other Topics Concern  . Not on file   Social History Narrative   Patient lives at home with his wife Roselyn Reef).    Patient is unemployed.    Education some college.   Caffeine green tea.   Right handed.        PHYSICAL EXAM  Vitals:   10/26/16 1450  BP: 118/79  Pulse: 83  Weight: 206 lb 9.6 oz (93.7 kg)  Height: _0  (1.778 m)   Body mass index is 29.64 kg/m.  Generalized: Well developed, in no acute distress  Head: normocephalic and atraumatic,. Oropharynx benign  Neck: Supple, no carotid bruits  Cardiac: Regular rate rhythm, no murmur  Musculoskeletal: No deformity   Neurological examination   Mentation: Alert oriented to time, place, history taking. Attention span and concentration appropriate. Recent and remote memory intact.  Follows all commands speech and language fluent.   Cranial nerve II-XII: Left pupil does not react right pupil reacts to light extraocular movements were full, unable to count fingers due to blindness  Facial sensation and strength were normal. hearing was intact to finger rubbing bilaterally. Uvula tongue midline. head turning and shoulder shrug were normal and symmetric.Tongue protrusion into cheek strength was normal. Motor: Moderate bilateral ankle dorsiflexion weakness  Sensory: Length dependent decreased fine touch, pinprick and vibratory to bilateral knee level  Coordination: finger-nose-finger, heel-to-shin with ataxia  Reflexes: Absent plantar responses were flexor bilaterally. Gait and Station: Rising up from seated position with assistance, wide based cautious gait with rolling walker and bilateral foot drop, standby assist DIAGNOSTIC DATA (LABS, IMAGING, TESTING) - I reviewed patient records, labs, notes, testing and imaging myself where available.  Lab Results  Component Value Date   WBC 6.0 02/25/2010   HGB 10.3 (L) 02/25/2010   HCT 33.0  (L) 02/25/2010   MCV 87.1 02/25/2010   PLT 103 (L) 02/25/2010      Component Value Date/Time   NA 139 02/25/2010 1834   K 3.8 02/25/2010 1834   CL 100 02/25/2010 1834   CO2 26 02/25/2010 1834   GLUCOSE 174 (H) 02/25/2010 1834   BUN 19 02/25/2010 1834   CREATININE 3.53 (H) 02/25/2010 1834   CALCIUM 8.2 (L) 02/25/2010 1834   PROT 7.2 10/09/2009 2243   ALBUMIN 3.2 (L) 10/09/2009 2243   AST 44 (H) 10/09/2009 2243   ALT  29 10/09/2009 2243   ALKPHOS 248 (H) 10/09/2009 2243   BILITOT 0.7 10/09/2009 2243   GFRNONAA 20 (L) 02/25/2010 1834   GFRAA (L) 02/25/2010 1834    24        The eGFR has been calculated using the MDRD equation. This calculation has not been validated in all clinical situations. eGFR's persistently <60 mL/min signify possible Chronic Kidney Disease.    Lab Results  Component Value Date   VITAMINB12 >2000 (H) 03/21/2009       ASSESSMENT AND PLAN  41 y.o. year old male  has a past medical history of Anxiety; Blindness; Depression; Diabetes ([place]); Hand numbness; Neuropathy; Right arm numbness; Right leg numbness; Sepsis ([place]); Shingles; and Urinary retention. History of stroke in 2015, status post pancreatic and kidney transplant, diabetic peripheral neuropathy, baseline gait difficulty which is multifactorial due to distal weakness and diabetic peripheral neuropathy residual mild right-sided weakness and right V1 postherpetic Neuralgia since shingles in 2017   Continue gabapentin 200 mg 3 times a day for post herpetic neuralgia Continue using walker for safe ambulation Follow-up yearly and when necessary Made patient aware there is no cure for PHN and then may take years to recover. Wearing loose comfortable clothing I spent 20 min in total face to face time with the patient more than 50% of which was spent counseling and coordination of care, reviewing test results reviewing medications and discussing and reviewing his record with multiple diagnoses and  discussing PHN.  Dennie Bible, [facility], [facility], APRN  [facility] [address], [place], [place] [address] [phone]

## 2016-10-26 ENCOUNTER — Ambulatory Visit (INDEPENDENT_AMBULATORY_CARE_PROVIDER_SITE_OTHER): Payer: Medicare Other | Admitting: Nurse Practitioner

## 2016-10-26 ENCOUNTER — Encounter: Payer: Self-pay | Admitting: Nurse Practitioner

## 2016-10-26 ENCOUNTER — Encounter (INDEPENDENT_AMBULATORY_CARE_PROVIDER_SITE_OTHER): Payer: Self-pay

## 2016-10-26 VITALS — BP 118/79 | HR 83 | Ht 70.0 in | Wt 206.6 lb

## 2016-10-26 DIAGNOSIS — R269 Unspecified abnormalities of gait and mobility: Secondary | ICD-10-CM

## 2016-10-26 DIAGNOSIS — Z8673 Personal history of transient ischemic attack (TIA), and cerebral infarction without residual deficits: Secondary | ICD-10-CM | POA: Diagnosis not present

## 2016-10-26 DIAGNOSIS — B0229 Other postherpetic nervous system involvement: Secondary | ICD-10-CM

## 2016-10-26 DIAGNOSIS — G629 Polyneuropathy, unspecified: Secondary | ICD-10-CM

## 2016-10-26 NOTE — Progress Notes (Signed)
I have reviewed and agreed above plan. 

## 2016-10-26 NOTE — Patient Instructions (Signed)
Continue gabapentin 200 mg 3 times a day for post herpetic neuralgia Follow-up yearly and when necessary

## 2017-10-26 NOTE — Progress Notes (Signed)
[facility]  PATIENT: Fernando Woods DOB: 1975-08-01   REASON FOR VISIT: Follow-up for postherpetic neuralgia, prior history of stroke in 2015, patient is blind   HISTORY FROM:Patient and Dad    HISTORY OF PRESENT ILLNESS:HISTORY OF PRESENT ILLNESS:UPDATE 10/7/2019CM Mr. Fernando Woods, 42 year old male returns for follow-up with history of stroke in 2015 and postherpetic neuralgia.  He returns to the office visit with his dad he has been sick nausea and vomiting for over 2 weeks.  Father states he was in the hospital in September 13th for heart attack received 2 stents this was at [facility] in [place].He is now on aspirin and Brilinta and will remain on that for 1 year.  Readmitted to [facility] the end of September for nausea and vomiting.  Father states multiple tests were done and he was given IV fluids and discharged.  Father also states that he was not given his Effexor for several days and his anxiety level escalated.  In terms of his postherpetic neuralgia which we see him for his symptoms are stable and he remains on gabapentin.  He has a history of diabetic peripheral neuropathy went blind in 2011 kidney and pancreatic transplantation in 2012.  He currently resides in a skilled facility.  He returns for reevaluation     10/3/18CM Mr. Fernando Woods, 42 year old male returns for follow-up. He was last seen in this office 04/09/14. He has a history of sudden onset gait difficulty, an MRI of the brain in November 9,2015 showing subacute stroke in the left lateral thalamic extending to coronary radiata lacunar infarcts. Mild changes of chronic microvascular ischemia, paranasal sinusitis as well as abnormal appearance of the left eye likely changes of chronic diabetic retinopathy.2-D echo was normal. C. His gait difficulty has improved with physical therapy and he is continuing to perform -home exercises. He is unable to tolerate Aggrenox due to significant side effects and  he had severe acid reflux on Plavix.He is currently taking aspirin 325 daily. He denies further stroke or TIA symptoms.He returns for reevaluation HISTORY:Fernando Woods is a 42 years old right-handed male, referred by his primary care PA Alvis Lemmings O'Buch for evaluation of sudden onset gait difficulty, he is accompanied by his father in Nov 2015. He had a past medical history of type 1 diabetes since age 89, he had a severe complications, including diabetic peripheral neuropathy, went blind in 2011 from diabetic retinopathy, only has light sensitivity in his right eye, he underwent kidney, pancreatic transplantation in 2012 at [facility], recovered very well, reported glucoses under excellent control, he is taking baby aspirin daily At baseline, he lives at home with his wife, able to work around home without gait difficulty, other than the limitation from his blindness, in November 19 2013, he woke up, noticed lost of coordination of his right hand, and his right leg, profound gait difficulty ever since,  MRI of the brain showed subacute left lateral thalamic/coronary radiata infarction. Mild changes of chronic microvascular ischemia, paranasal sinusitis as well as abnormal appearance of the left eye likely changes of chronic diabetic retinopathy. His gait difficulty has mild improvement, he is on Plavix, but he also reported that he has severe acid reflux, taking protonix 40 mg daily Echocardiogram was normal, ultrasound of carotid artery showed no significant abnormality, He also has diabetic peripheral neuropathy, orthostatic hypotension, seen physician at [facility] for this, he is taking fludrocortisone 1 tablet a day UPDATE July 15 2015: He had incomplete recovery from his  stroke in 2015, began to rely on his walker, over the past 2 years, he had different course of physical therapy, which has helped his stamina.  He developed right V1 shingle on May 2017, require hospital  admission to [facility] for constipation, poor appetite, UTI, sepsis, he was noted to be confused, when he tried to stand up during his hospital case, he fell down with left frontal abrasion.   He  has developed right forehead neuropathic pain, is taking gabapentin 177m tid, with help, but often not adequate. He still has right forehead area itching burning numbness sensation especially during early morning time.   He has been taking Metanx for his diabetic peripheral neuropathy, which has been very helpful, he noticed a change, increased bilateral lower extremity paresthesia, if he missed his medications, he also has increased energy with Metanx supplement  UPDATE Sept 27th 2017:YY He has right frontal area postherpatic neuragial,   UPDATE10/3/ 2018CM Mr. Fernando Woods 42year old male returns for follow-up with past medical history of stroke in 2015, he was unable to tolerate Aggrenox or Plavix and so is currently on aspirin 325 daily without recurrent stroke or TIA symptoms. He has been a diabetic since the age of 89with severe complications, he has been blind since 2011 from diabetic retinopathy he had kidney and pancreatic transplant in 2012. He continues to have gait abnormality for stroke and walks with a rolling walker and standby assist. He is currently receiving some physical therapy,  he had shingles in 2017 and continues to have some postherpetic neuralgia and bilateral lower extremity paresthesias. He is currently on gabapentin with good control of his symptoms. He has previously been seen in this office by Dr. YKrista Blue [facility] [place] He is going through a divorce. He returns for reevaluation REVIEW OF SYSTEMS: Full 14 system review of systems performed and notable only for those listed, all others are neg:  Constitutional: Fatigue Cardiovascular: neg Ear/Nose/Throat: neg  Skin: neg Eyes: Blind Respiratory:  neg Gastroitestinal: Nausea diarrhea Hematology/Lymphatic: neg  Endocrine: neg Musculoskeletal:neg Allergy/Immunology: neg Neurological: Postherpetic neuralgia, dizziness Psychiatric: Depression and anxiety Sleep : neg   ALLERGIES: Allergies  Allergen Reactions  . Morphine And Related Anxiety    Withdrawal type feelings  . Lisinopril Other (See Comments)    HA, affected vision  . Sulfonamide Derivatives     REACTION: hives    HOME MEDICATIONS: Outpatient Medications Prior to Visit  Medication Sig Dispense Refill  . Acetaminophen (TYLENOL PO) Take by mouth as needed.    .Marland Kitchenaspirin 325 MG tablet Take 325 mg by mouth daily.    . calcitRIOL (ROCALTROL) 0.25 MCG capsule Take 0.25 mcg by mouth daily.    . cyclobenzaprine (FLEXERIL) 10 MG tablet     . dapsone 25 MG tablet Take 50 mg by mouth daily.     . fludrocortisone (FLORINEF) 0.1 MG tablet Take 0.1 mcg by mouth 3 (three) times daily.   6  . folic acid (FOLVITE) 4914MCG tablet Take 0.4 mg by mouth.    . gabapentin (NEURONTIN) 100 MG capsule Take 2 capsules (200 mg total) by mouth 3 (three) times daily. 180 capsule 11  . levothyroxine (SYNTHROID) 25 MCG tablet Take 25 mcg by mouth daily before breakfast.    . loratadine (CLARITIN) 10 MG tablet Take 10 mg by mouth.    .Marland KitchenLORazepam (ATIVAN) 1 MG tablet Take 1 mg by mouth every 6 (six) hours as needed.     .Marland Kitchen  magnesium chloride (SLOW-MAG) 64 MG TBEC SR tablet Take 1 tablet by mouth every other day.     . metoCLOPramide (REGLAN) 5 MG tablet Take 5 mg by mouth 3 (three) times daily before meals.     . mycophenolate (MYFORTIC) 180 MG EC tablet Take 360 mg by mouth 2 (two) times daily.     . pantoprazole (PROTONIX) 40 MG tablet Take 40 mg by mouth 2 (two) times daily.     . potassium citrate (UROCIT-K) 10 MEQ (1080 MG) SR tablet Take 30 mEq by mouth daily.     . predniSONE (DELTASONE) 5 MG tablet Take 5 mg by mouth daily with breakfast.   6  . PSYLLIUM PO Take by mouth.    . tacrolimus  (PROGRAF) 1 MG capsule Take 3 mg by mouth 2 (two) times daily.     . tamsulosin (FLOMAX) 0.4 MG CAPS capsule Take 0.4 mg by mouth.    . venlafaxine XR (EFFEXOR-XR) 150 MG 24 hr capsule Take 225 mg by mouth daily.     Marland Kitchen venlafaxine XR (EFFEXOR-XR) 75 MG 24 hr capsule Take 75 mg by mouth daily with breakfast.     . vitamin B-12 (CYANOCOBALAMIN) 1000 MCG tablet Take by mouth.     No facility-administered medications prior to visit.     PAST MEDICAL HISTORY: Past Medical History:  Diagnosis Date  . Anxiety   . Blindness   . Depression   . Diabetes ([place])   . Hand numbness   . Neuropathy   . Right arm numbness   . Right leg numbness   . Sepsis ([place])   . Shingles   . Urinary retention     PAST SURGICAL HISTORY: Past Surgical History:  Procedure Laterality Date  . COMBINED KIDNEY-PANCREAS TRANSPLANT    . EYE SURGERY Bilateral   . KIDNEY SURGERY    . WISDOM TOOTH EXTRACTION      FAMILY HISTORY: Family History  Problem Relation Age of Onset  . Breast cancer Mother   . Diabetes type II Mother   . Depression Mother   . Heart disease Father   . Dementia Maternal Grandmother   . Psychiatric Illness Unknown     SOCIAL HISTORY: Social History   Socioeconomic History  . Marital status: Married    Spouse name: Roselyn Reef  . Number of children: 0  . Years of education: 10  . Highest education level: Not on file  Occupational History    Comment: Not Working   Social Needs  . Financial resource strain: Not on file  . Food insecurity:    Worry: Not on file    Inability: Not on file  . Transportation needs:    Medical: Not on file    Non-medical: Not on file  Tobacco Use  . Smoking status: Never Smoker  . Smokeless tobacco: Never Used  Substance and Sexual Activity  . Alcohol use: No    Alcohol/week: 0.0 standard drinks  . Drug use: No  . Sexual activity: Not on file  Lifestyle  . Physical activity:    Days per week: Not on file    Minutes per session: Not on file  .  Stress: Not on file  Relationships  . Social connections:    Talks on phone: Not on file    Gets together: Not on file    Attends religious service: Not on file    Active member of club or organization: Not on file    Attends meetings of clubs  or organizations: Not on file    Relationship status: Not on file  . Intimate partner violence:    Fear of current or ex partner: Not on file    Emotionally abused: Not on file    Physically abused: Not on file    Forced sexual activity: Not on file  Other Topics Concern  . Not on file  Social History Narrative   Patient lives at home with his wife Roselyn Reef).    Patient is unemployed.    Education some college.   Caffeine green tea.   Right handed.     PHYSICAL EXAM  Vitals:   10/30/17 1432  BP: (!) 89/54  Pulse: 71   There is no height or weight on file to calculate BMI.  Generalized: Well developed, nauseated no vomiting Head: normocephalic and atraumatic,. Oropharynx benign  Neck: Supple,   Musculoskeletal: No deformity   Neurological examination   Mentation: Alert oriented to time, place, history taking. Attention span and concentration appropriate. Recent and remote memory intact.  Follows all commands speech and language fluent.   Cranial nerve II-XII: Left pupil does not react right pupil reacts to light extraocular movements were full, unable to count fingers due to blindness  Facial sensation and strength were normal. hearing was intact to finger rubbing bilaterally. Uvula tongue midline. head turning and shoulder shrug were normal and symmetric.Tongue protrusion into cheek strength was normal. Motor: Moderate bilateral ankle dorsiflexion weakness  Sensory: Length dependent decreased fine touch, pinprick and vibratory to bilateral knee level  Coordination: finger-nose-finger, with ataxia  Reflexes: Absent plantar responses were flexor bilaterally. Gait and Station: Not ambulated    DIAGNOSTIC DATA (LABS, IMAGING,  TESTING) - I reviewed patient records, labs, notes, testing and imaging myself where available.  Lab Results  Component Value Date   WBC 6.0 02/25/2010   HGB 10.3 (L) 02/25/2010   HCT 33.0 (L) 02/25/2010   MCV 87.1 02/25/2010   PLT 103 (L) 02/25/2010      Component Value Date/Time   NA 139 02/25/2010 1834   K 3.8 02/25/2010 1834   CL 100 02/25/2010 1834   CO2 26 02/25/2010 1834   GLUCOSE 174 (H) 02/25/2010 1834   BUN 19 02/25/2010 1834   CREATININE 3.53 (H) 02/25/2010 1834   CALCIUM 8.2 (L) 02/25/2010 1834   PROT 7.2 10/09/2009 2243   ALBUMIN 3.2 (L) 10/09/2009 2243   AST 44 (H) 10/09/2009 2243   ALT 29 10/09/2009 2243   ALKPHOS 248 (H) 10/09/2009 2243   BILITOT 0.7 10/09/2009 2243   GFRNONAA 20 (L) 02/25/2010 1834   GFRAA (L) 02/25/2010 1834    24        The eGFR has been calculated using the MDRD equation. This calculation has not been validated in all clinical situations. eGFR's persistently <60 mL/min signify possible Chronic Kidney Disease.    Lab Results  Component Value Date   VITAMINB12 >2000 (H) 03/21/2009       ASSESSMENT AND PLAN  42 y.o. year old male  has a past medical history of Anxiety; Blindness; Depression; Diabetes ([place]); Hand numbness; Neuropathy; Right arm numbness; Right leg numbness; Sepsis ([place]); Shingles; and Urinary retention. History of stroke in 2015, status post pancreatic and kidney transplant, diabetic peripheral neuropathy, baseline gait difficulty which is multifactorial due to distal weakness and diabetic peripheral neuropathy residual mild right-sided weakness and right V1 postherpetic Neuralgia since shingles in 2017.  Hospitalization September 13 for hot heart attack received 2 stents.  Now on aspirin  and Brilinta.  Readmitted to [facility] for nausea vomiting the end of September.  Multiple slot labs run and IV fluids given.  Patient remains hypotensive today  Discussed with Dr. Krista Blue Continue gabapentin 200 mg 3 times  a day for post herpetic neuralgia Continue using walker/wheelchair  for safe ambulation Encourage fluid intake due to nausea and vomiting Follow-up with primary care regarding his nausea and vomiting Follow-up with psychiatry for adjustments to his Effexor and increased anxiety Follow-up yearly and when necessary Dennie Bible, [facility], [facility], [facility] [address], [place], [place] [address] [phone]

## 2017-10-30 ENCOUNTER — Ambulatory Visit (INDEPENDENT_AMBULATORY_CARE_PROVIDER_SITE_OTHER): Payer: Medicare Other | Admitting: Nurse Practitioner

## 2017-10-30 ENCOUNTER — Encounter: Payer: Self-pay | Admitting: Nurse Practitioner

## 2017-10-30 VITALS — BP 89/54 | HR 71

## 2017-10-30 DIAGNOSIS — R269 Unspecified abnormalities of gait and mobility: Secondary | ICD-10-CM | POA: Diagnosis not present

## 2017-10-30 DIAGNOSIS — G629 Polyneuropathy, unspecified: Secondary | ICD-10-CM

## 2017-10-30 DIAGNOSIS — B0229 Other postherpetic nervous system involvement: Secondary | ICD-10-CM

## 2017-10-30 NOTE — Patient Instructions (Signed)
Per skillled sheet

## 2017-11-01 NOTE — Progress Notes (Signed)
I have reviewed and agreed above plan. 

## 2018-11-01 ENCOUNTER — Ambulatory Visit: Payer: Medicare Other | Admitting: Neurology

## 2018-11-28 ENCOUNTER — Ambulatory Visit: Payer: Self-pay | Admitting: Neurology
